# Patient Record
Sex: Female | Born: 1975 | Race: White | Hispanic: No | Marital: Married | Smoking: Never smoker
Health system: Southern US, Community
[De-identification: ages and names within clinical notes are randomized; demographics above are authoritative.]

## PROBLEM LIST (undated history)

## (undated) DIAGNOSIS — F419 Anxiety disorder, unspecified: Secondary | ICD-10-CM

## (undated) DIAGNOSIS — K638219 Small intestinal bacterial overgrowth, unspecified: Secondary | ICD-10-CM

## (undated) DIAGNOSIS — T7840XA Allergy, unspecified, initial encounter: Secondary | ICD-10-CM

## (undated) HISTORY — DX: Small intestinal bacterial overgrowth, unspecified: K63.8219

## (undated) HISTORY — DX: Allergy, unspecified, initial encounter: T78.40XA

## (undated) HISTORY — DX: Anxiety disorder, unspecified: F41.9

---

## 2002-12-06 ENCOUNTER — Other Ambulatory Visit: Admission: RE | Admit: 2002-12-06 | Discharge: 2002-12-06 | Payer: Self-pay | Admitting: Obstetrics and Gynecology

## 2005-06-30 ENCOUNTER — Other Ambulatory Visit: Admission: RE | Admit: 2005-06-30 | Discharge: 2005-06-30 | Payer: Self-pay | Admitting: Obstetrics and Gynecology

## 2006-01-26 ENCOUNTER — Inpatient Hospital Stay (HOSPITAL_COMMUNITY): Admission: AD | Admit: 2006-01-26 | Discharge: 2006-01-29 | Payer: Self-pay | Admitting: Obstetrics and Gynecology

## 2006-01-26 ENCOUNTER — Inpatient Hospital Stay (HOSPITAL_COMMUNITY): Admission: AD | Admit: 2006-01-26 | Discharge: 2006-01-26 | Payer: Self-pay | Admitting: Obstetrics and Gynecology

## 2006-08-16 ENCOUNTER — Other Ambulatory Visit: Admission: RE | Admit: 2006-08-16 | Discharge: 2006-08-16 | Payer: Self-pay | Admitting: Obstetrics and Gynecology

## 2008-08-10 ENCOUNTER — Encounter: Admission: RE | Admit: 2008-08-10 | Discharge: 2008-08-10 | Payer: Self-pay | Admitting: Internal Medicine

## 2008-09-13 ENCOUNTER — Encounter: Admission: RE | Admit: 2008-09-13 | Discharge: 2008-09-13 | Payer: Self-pay | Admitting: Gynecology

## 2008-09-20 ENCOUNTER — Encounter: Admission: RE | Admit: 2008-09-20 | Discharge: 2008-09-20 | Payer: Self-pay | Admitting: Gynecology

## 2008-09-26 ENCOUNTER — Other Ambulatory Visit: Admission: RE | Admit: 2008-09-26 | Discharge: 2008-09-26 | Payer: Self-pay | Admitting: Gynecology

## 2009-02-01 ENCOUNTER — Encounter: Admission: RE | Admit: 2009-02-01 | Discharge: 2009-02-01 | Payer: Self-pay | Admitting: Gynecology

## 2009-07-11 ENCOUNTER — Inpatient Hospital Stay (HOSPITAL_COMMUNITY): Admission: AD | Admit: 2009-07-11 | Discharge: 2009-07-11 | Payer: Self-pay | Admitting: Obstetrics and Gynecology

## 2009-07-13 ENCOUNTER — Inpatient Hospital Stay (HOSPITAL_COMMUNITY): Admission: AD | Admit: 2009-07-13 | Discharge: 2009-07-13 | Payer: Self-pay | Admitting: Obstetrics & Gynecology

## 2009-09-27 ENCOUNTER — Inpatient Hospital Stay (HOSPITAL_COMMUNITY): Admission: AD | Admit: 2009-09-27 | Discharge: 2009-09-27 | Payer: Self-pay | Admitting: Obstetrics and Gynecology

## 2009-10-07 ENCOUNTER — Inpatient Hospital Stay (HOSPITAL_COMMUNITY): Admission: AD | Admit: 2009-10-07 | Discharge: 2009-10-09 | Payer: Self-pay | Admitting: Obstetrics and Gynecology

## 2009-10-12 HISTORY — PX: CHOLECYSTECTOMY, LAPAROSCOPIC: SHX56

## 2010-01-24 ENCOUNTER — Telehealth: Payer: Self-pay | Admitting: Gastroenterology

## 2010-01-24 ENCOUNTER — Encounter (INDEPENDENT_AMBULATORY_CARE_PROVIDER_SITE_OTHER): Payer: Self-pay | Admitting: *Deleted

## 2010-02-17 ENCOUNTER — Encounter (INDEPENDENT_AMBULATORY_CARE_PROVIDER_SITE_OTHER): Payer: Self-pay | Admitting: *Deleted

## 2010-09-18 ENCOUNTER — Emergency Department (HOSPITAL_COMMUNITY): Admission: EM | Admit: 2010-09-18 | Discharge: 2010-01-24 | Payer: Self-pay | Admitting: Emergency Medicine

## 2010-11-02 ENCOUNTER — Encounter: Payer: Self-pay | Admitting: Gynecology

## 2010-11-11 NOTE — Progress Notes (Signed)
Summary: triage  Phone Note Call from Patient Call back at Home Phone [phone]   Caller: Patient Call For: Dr. Christella Hartigan Reason for Call: Talk to Nurse Summary of Call: pt sch'ed for next available, but intrerested in getting in sooner for gallbladder stones symptoms and follow up on ER visit due to severe back pain and nausea Initial call taken by: Vallarie Mare,  January 24, 2010 10:32 AM  Follow-up for Phone Call        Message left to call back   Teryl Lucy RN  January 24, 2010 11:21 AM  left message on machine to call back Chales Abrahams CMA Duncan Dull)  January 27, 2010 9:15 AM   left message on machine to call back Chales Abrahams CMA Duncan Dull)  January 28, 2010 3:20 PM

## 2010-11-11 NOTE — Consult Note (Signed)
Summary: consult   [phone] ********* correct consult date******    NAME:  Kristi [name], Kristi [name]                 ACCOUNT NO.:  [id number]      MEDICAL RECORD NO.:  [id number]          PATIENT TYPE:  EMS      LOCATION:  MAJO                         FACILITY:  [facility]      PHYSICIAN:  Juanetta Gosling, MDDATE OF BIRTH:  1976/08/07      DATE OF CONSULTATION:   DATE OF DISCHARGE:  01/24/2010                                    CONSULTATION      CONSULTING PHYSICIAN:  Marisa Severin, MD      CHIEF COMPLAINT:  Back pain.      HISTORY OF PRESENT ILLNESS:  This is a 35 year old female who is 3-1/2   months postpartum and currently breastfeeding, who awoke last night at   2315 with midback pain radiating to both sides.  The pain is associated   with some nausea.  She denies any abdominal pain, diarrhea, changes in   her bowel habits.  No fevers and no urinary symptoms.  It has been   aggravated by movement, it is better when I evaluated her this morning   and relieved with some of the medications she received in the emergency   room.  She has no history of any prior abdominal pain, right upper   quadrant pain, or especially during her pregnancy at all.  We were   consulted for evaluation for her to rule out gallbladder disease.      PAST MEDICAL HISTORY:   1. GERD.   2. Mitral valve prolapse.   3. Hypothyroidism.      PAST SURGICAL HISTORY:  None.      MEDICATIONS:  Levoxyl, prenatal vitamins, and Claritin.      ALLERGIES:  TORADOL and VICODIN which cause itching.      SOCIAL HISTORY:  She drinks about half a glass of wine 3 times per week,   is a nonsmoker.      PHYSICAL EXAMINATION:  VITAL SIGNS:  97.9, 97.4; 73-100, 103/71, 18, and   97%.   GENERAL:  She is a well-appearing female in no distress.   ABDOMEN:  Soft, nontender, nondistended with bowel sounds present.  She   has a negative Murphy sign.   BACK:  Mild tenderness in her midback, but no flank pain.      LABORATORY EVALUATION:   BUN 17, creatinine 0.78.  Her liver function   tests are alkaline phosphatase is 86, AST 118, ALT 73, total bilirubin   0.8.  Urinalysis is negative for infection.  White blood cell count is   16.7, hematocrit 38.2.  She has 90 segs on her differential.  Ultrasound   shows numerous stones, no wall thickening, no fluid, no sonographic   Murphy sign, a common bile duct of 4 mm.  Liver, spleen, and kidneys are   all within normal limits.      ASSESSMENT:  Back pain, doubt gallbladder disease as a source.      PLAN:  I think she currently really has no abdominal symptoms at  all.   Her transaminases are mildly elevated from an unknown source, but I do   not think that her current symptoms and exam are consistent with   gallbladder disease.  I am not entirely sure why her white blood cell   count is elevated as well.  There is no clinical or radiologic evidence   that this is from her gallbladder.  I have asked her to come back and   see me in 3 weeks in the office where we will repeat her labs and will   follow up with her and I gave her warning signs for gallbladder disease   if any of these develop, she should return.               Juanetta Gosling, MD            MCW/MEDQ  D:  01/24/2010  T:  01/24/2010  Job:  [id number]      cc:   Kirby Funk, MD      Electronically Signed by Emelia Loron MD on 01/25/2010 08:51:36 AM

## 2010-11-11 NOTE — Letter (Signed)
Summary: New Patient letter  [facility]  [address] [place], [place] [address]   Phone: [phone]  Fax: [phone]       01/24/2010 MRN: [id number]  [facility] [address] [place], [place]  [address]  Dear Ms. [facility],  Welcome to the Gastroenterology Division at [facility].    You are scheduled to see Dr.  Christella Hartigan on 02/21/2010 at 1:30PM on the 3rd floor at [facility], [address].  We ask that you try to arrive at our office 15 minutes prior to your appointment time to allow for check-in.  We would like you to complete the enclosed self-administered evaluation form prior to your visit and bring it with you on the day of your appointment.  We will review it with you.  Also, please bring a complete list of all your medications or, if you prefer, bring the medication bottles and we will list them.  Please bring your insurance card so that we may make a copy of it.  If your insurance requires a referral to see a specialist, please bring your referral form from your primary care physician.  Co-payments are due at the time of your visit and may be paid by cash, check or credit card.     Your office visit will consist of a consult with your physician (includes a physical exam), any laboratory testing he/she may order, scheduling of any necessary diagnostic testing (e.g. x-ray, ultrasound, CT-scan), and scheduling of a procedure (e.g. Endoscopy, Colonoscopy) if required.  Please allow enough time on your schedule to allow for any/all of these possibilities.    If you cannot keep your appointment, please call [phone] to cancel or reschedule prior to your appointment date.  This allows [place] the opportunity to schedule an appointment for another patient in need of care.  If you do not cancel or reschedule by 5 p.m. the business day prior to your appointment date, you will be charged a $50.00 late cancellation/no-show fee.    Thank you for choosing  [facility] for your medical needs.  We appreciate the opportunity to care for you.  Please visit [place] at our website  to learn more about our practice.                     Sincerely,                                                             The Gastroenterology Division

## 2010-12-30 LAB — COMPREHENSIVE METABOLIC PANEL
AST: 118 U/L — ABNORMAL HIGH (ref 0–37)
Alkaline Phosphatase: 86 U/L (ref 39–117)
Calcium: 9.7 mg/dL (ref 8.4–10.5)
Creatinine, Ser: 0.78 mg/dL (ref 0.4–1.2)
GFR calc Af Amer: >60 mL/min (ref >60)
Glucose, Bld: 116 mg/dL — ABNORMAL HIGH (ref 70–99)
Potassium: 3.4 mEq/L — ABNORMAL LOW (ref 3.5–5.1)
Sodium: 141 mEq/L (ref 135–145)

## 2010-12-30 LAB — DIFFERENTIAL
Basophils Absolute: 0.1 10*3/uL (ref 0.0–0.1)
Basophils Relative: 0 % (ref 0–1)
Eosinophils Absolute: 0 10*3/uL (ref 0.0–0.7)
Eosinophils Relative: 0 % (ref 0–5)
Lymphs Abs: 1 10*3/uL (ref 0.7–4.0)
Monocytes Absolute: 0.6 10*3/uL (ref 0.1–1.0)
Neutro Abs: 15 10*3/uL — ABNORMAL HIGH (ref 1.7–7.7)

## 2010-12-30 LAB — CBC
MCV: 96.8 fL (ref 78.0–100.0)
RBC: 3.94 MIL/uL (ref 3.87–5.11)
WBC: 16.7 10*3/uL — ABNORMAL HIGH (ref 4.0–10.5)

## 2010-12-30 LAB — URINALYSIS, ROUTINE W REFLEX MICROSCOPIC
Bilirubin Urine: NEGATIVE
Glucose, UA: NEGATIVE mg/dL
Ketones, ur: NEGATIVE mg/dL
pH: 6.5 (ref 5.0–8.0)

## 2010-12-30 LAB — POCT PREGNANCY, URINE: Preg Test, Ur: NEGATIVE

## 2011-01-12 LAB — CBC
HCT: 35.6 % — ABNORMAL LOW (ref 36.0–46.0)
Hemoglobin: 12.1 g/dL (ref 12.0–15.0)
MCHC: 34.1 g/dL (ref 30.0–36.0)
Platelets: 294 10*3/uL (ref 150–400)
Platelets: 328 10*3/uL (ref 150–400)
RDW: 13.9 % (ref 11.5–15.5)
WBC: 16.2 10*3/uL — ABNORMAL HIGH (ref 4.0–10.5)
WBC: 16.6 10*3/uL — ABNORMAL HIGH (ref 4.0–10.5)

## 2011-01-12 LAB — DIFFERENTIAL
Basophils Absolute: 0 10*3/uL (ref 0.0–0.1)
Basophils Relative: 0 % (ref 0–1)
Eosinophils Absolute: 0 10*3/uL (ref 0.0–0.7)
Lymphocytes Relative: 14 % (ref 12–46)
Lymphs Abs: 2.2 10*3/uL (ref 0.7–4.0)
Monocytes Absolute: 0.7 10*3/uL (ref 0.1–1.0)
Monocytes Relative: 4 % (ref 3–12)
Neutro Abs: 13.2 10*3/uL — ABNORMAL HIGH (ref 1.7–7.7)
Neutrophils Relative %: 82 % — ABNORMAL HIGH (ref 43–77)

## 2011-01-15 LAB — FETAL FIBRONECTIN: Fetal Fibronectin: NEGATIVE

## 2011-01-16 LAB — URINALYSIS, ROUTINE W REFLEX MICROSCOPIC
Hgb urine dipstick: NEGATIVE
Ketones, ur: NEGATIVE mg/dL
Nitrite: NEGATIVE
Specific Gravity, Urine: <1.005 — ABNORMAL LOW (ref 1.005–1.030)
Urobilinogen, UA: 0.2 mg/dL (ref 0.0–1.0)
pH: 7 (ref 5.0–8.0)
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
## 2013-05-18 ENCOUNTER — Other Ambulatory Visit (HOSPITAL_COMMUNITY): Payer: Self-pay | Admitting: Internal Medicine

## 2013-05-18 DIAGNOSIS — I341 Nonrheumatic mitral (valve) prolapse: Secondary | ICD-10-CM

## 2013-05-23 ENCOUNTER — Ambulatory Visit (HOSPITAL_COMMUNITY)
Admission: RE | Admit: 2013-05-23 | Discharge: 2013-05-23 | Disposition: A | Discharge status: Home / Self Care | Payer: BC Managed Care – PPO | Source: Ambulatory Visit | Attending: Internal Medicine | Admitting: Internal Medicine

## 2013-05-23 DIAGNOSIS — I341 Nonrheumatic mitral (valve) prolapse: Secondary | ICD-10-CM

## 2013-05-23 DIAGNOSIS — I059 Rheumatic mitral valve disease, unspecified: Secondary | ICD-10-CM

## 2013-05-23 DIAGNOSIS — R002 Palpitations: Secondary | ICD-10-CM | POA: Insufficient documentation

## 2013-05-23 DIAGNOSIS — I079 Rheumatic tricuspid valve disease, unspecified: Secondary | ICD-10-CM | POA: Insufficient documentation

## 2013-05-23 NOTE — Progress Notes (Signed)
2D Echo Performed 05/23/2013    Hoang Pettingill, RCS  

## 2013-05-26 ENCOUNTER — Telehealth: Payer: Self-pay | Admitting: Internal Medicine

## 2013-05-26 NOTE — Telephone Encounter (Signed)
Dr. Renne Crigler referred pt for echo she had on August 12.  Patient is calling Dr. Carolee Rota office for result.

## 2013-05-26 NOTE — Telephone Encounter (Signed)
Returned call.  Joy informed echo was completed.  Stated they did not receive a copy of result.  Please fax to [phone].  Informed Medical Records will be notified.  Message forwarded to Medical Records to process.

## 2013-06-06 ENCOUNTER — Ambulatory Visit: Payer: BC Managed Care – PPO | Admitting: Internal Medicine

## 2014-04-16 ENCOUNTER — Telehealth: Payer: Self-pay | Admitting: Interventional Cardiology

## 2014-04-16 DIAGNOSIS — I059 Rheumatic mitral valve disease, unspecified: Secondary | ICD-10-CM

## 2014-04-16 NOTE — Telephone Encounter (Signed)
Dr. Eldridge [facility]anasi seen [facility] in August of 2014. Echo is in the system. Do we need to repeat?

## 2014-04-16 NOTE — Telephone Encounter (Signed)
New problem   Pt calling about need to have an echocardiogram. There was no order in system. Please advise pt.

## 2014-04-18 NOTE — Telephone Encounter (Signed)
Echo one year from teh prior echo for mitral regurgitation

## 2014-04-19 NOTE — Telephone Encounter (Signed)
Pt notified. Echo ordered. Pcc will schedule.

## 2014-05-24 ENCOUNTER — Ambulatory Visit (HOSPITAL_COMMUNITY): Payer: BC Managed Care – PPO | Attending: Interventional Cardiology | Admitting: Radiology

## 2014-05-24 DIAGNOSIS — I079 Rheumatic tricuspid valve disease, unspecified: Secondary | ICD-10-CM | POA: Insufficient documentation

## 2014-05-24 DIAGNOSIS — I059 Rheumatic mitral valve disease, unspecified: Secondary | ICD-10-CM

## 2014-05-24 NOTE — Progress Notes (Signed)
Echocardiogram performed.  

## 2014-05-30 ENCOUNTER — Other Ambulatory Visit: Payer: Self-pay | Admitting: Cardiology

## 2014-05-30 DIAGNOSIS — I059 Rheumatic mitral valve disease, unspecified: Secondary | ICD-10-CM

## 2014-07-04 ENCOUNTER — Other Ambulatory Visit: Payer: Self-pay | Admitting: Obstetrics and Gynecology

## 2014-07-04 DIAGNOSIS — N6002 Solitary cyst of left breast: Secondary | ICD-10-CM

## 2014-07-04 DIAGNOSIS — N6001 Solitary cyst of right breast: Secondary | ICD-10-CM

## 2014-07-10 ENCOUNTER — Other Ambulatory Visit: Payer: BC Managed Care – PPO

## 2014-07-10 ENCOUNTER — Ambulatory Visit
Admission: RE | Admit: 2014-07-10 | Discharge: 2014-07-10 | Disposition: A | Discharge status: Home / Self Care | Payer: BC Managed Care – PPO | Source: Ambulatory Visit | Attending: Obstetrics and Gynecology | Admitting: Obstetrics and Gynecology

## 2014-07-10 DIAGNOSIS — N6001 Solitary cyst of right breast: Secondary | ICD-10-CM

## 2014-07-10 DIAGNOSIS — N6002 Solitary cyst of left breast: Secondary | ICD-10-CM

## 2015-09-24 ENCOUNTER — Other Ambulatory Visit: Payer: Self-pay | Admitting: Obstetrics and Gynecology

## 2015-09-24 DIAGNOSIS — N644 Mastodynia: Secondary | ICD-10-CM

## 2015-09-30 ENCOUNTER — Ambulatory Visit
Admission: RE | Admit: 2015-09-30 | Discharge: 2015-09-30 | Disposition: A | Discharge status: Home / Self Care | Payer: BC Managed Care – PPO | Source: Ambulatory Visit | Attending: Obstetrics and Gynecology | Admitting: Obstetrics and Gynecology

## 2015-09-30 DIAGNOSIS — N644 Mastodynia: Secondary | ICD-10-CM

## 2015-12-19 ENCOUNTER — Telehealth: Payer: Self-pay | Admitting: Interventional Cardiology

## 2015-12-19 DIAGNOSIS — I340 Nonrheumatic mitral (valve) insufficiency: Secondary | ICD-10-CM

## 2015-12-19 NOTE — Telephone Encounter (Signed)
**Note De-Identified Kristi [name] Obfuscation** Is it ok to order her Echo? Her last OV with Dr Eldridge DaceVaranasi was in August of 2014 [facility]. Please advise.

## 2015-12-19 NOTE — Telephone Encounter (Signed)
Follow Up   Pt called states that she normally has an annual ECHO, please place orders.

## 2015-12-23 NOTE — Telephone Encounter (Signed)
Echo has been ordered and [facility] to [facility] the pt to arrange date and time of Echo.

## 2015-12-23 NOTE — Telephone Encounter (Signed)
OK to order echo for mitral regurgitation

## 2016-01-20 ENCOUNTER — Other Ambulatory Visit: Payer: Self-pay

## 2016-01-20 ENCOUNTER — Ambulatory Visit (HOSPITAL_COMMUNITY): Payer: BLUE CROSS/BLUE SHIELD | Attending: Cardiovascular Disease

## 2016-01-20 DIAGNOSIS — I340 Nonrheumatic mitral (valve) insufficiency: Secondary | ICD-10-CM | POA: Diagnosis not present

## 2016-01-20 DIAGNOSIS — I071 Rheumatic tricuspid insufficiency: Secondary | ICD-10-CM | POA: Diagnosis not present

## 2017-01-22 ENCOUNTER — Other Ambulatory Visit: Payer: Self-pay | Admitting: *Deleted

## 2019-10-23 ENCOUNTER — Other Ambulatory Visit: Payer: Self-pay | Admitting: Family Medicine

## 2019-10-23 DIAGNOSIS — N80129 Deep endometriosis of ovary, unspecified ovary: Secondary | ICD-10-CM

## 2019-10-23 DIAGNOSIS — N809 Endometriosis, unspecified: Secondary | ICD-10-CM

## 2019-10-23 DIAGNOSIS — R1032 Left lower quadrant pain: Secondary | ICD-10-CM

## 2019-11-02 ENCOUNTER — Ambulatory Visit
Admission: RE | Admit: 2019-11-02 | Discharge: 2019-11-02 | Disposition: A | Discharge status: Home / Self Care | Payer: BC Managed Care – PPO | Source: Ambulatory Visit | Attending: Family Medicine | Admitting: Family Medicine

## 2019-11-02 ENCOUNTER — Other Ambulatory Visit: Payer: Self-pay

## 2019-11-02 DIAGNOSIS — N80129 Deep endometriosis of ovary, unspecified ovary: Secondary | ICD-10-CM

## 2019-11-02 DIAGNOSIS — R1032 Left lower quadrant pain: Secondary | ICD-10-CM

## 2019-11-02 DIAGNOSIS — N809 Endometriosis, unspecified: Secondary | ICD-10-CM

## 2019-11-02 MED ORDER — IOPAMIDOL (ISOVUE-300) INJECTION 61%
100.0000 mL | Freq: Once | INTRAVENOUS | Status: AC | PRN
  Administered 2019-11-02: 100 mL via INTRAVENOUS

## 2020-03-07 ENCOUNTER — Other Ambulatory Visit: Payer: Self-pay | Admitting: Obstetrics and Gynecology

## 2020-03-07 DIAGNOSIS — R928 Other abnormal and inconclusive findings on diagnostic imaging of breast: Secondary | ICD-10-CM

## 2020-03-13 ENCOUNTER — Ambulatory Visit: Payer: BC Managed Care – PPO

## 2020-03-13 ENCOUNTER — Other Ambulatory Visit: Payer: Self-pay

## 2020-03-13 ENCOUNTER — Ambulatory Visit
Admission: RE | Admit: 2020-03-13 | Discharge: 2020-03-13 | Disposition: A | Discharge status: Home / Self Care | Payer: BC Managed Care – PPO | Source: Ambulatory Visit | Attending: Obstetrics and Gynecology | Admitting: Obstetrics and Gynecology

## 2020-03-13 DIAGNOSIS — R928 Other abnormal and inconclusive findings on diagnostic imaging of breast: Secondary | ICD-10-CM

## 2020-12-19 ENCOUNTER — Other Ambulatory Visit: Payer: Self-pay | Admitting: Obstetrics and Gynecology

## 2020-12-19 DIAGNOSIS — Z1231 Encounter for screening mammogram for malignant neoplasm of breast: Secondary | ICD-10-CM

## 2021-04-01 ENCOUNTER — Other Ambulatory Visit: Payer: Self-pay

## 2021-04-01 ENCOUNTER — Ambulatory Visit
Admission: RE | Admit: 2021-04-01 | Discharge: 2021-04-01 | Disposition: A | Discharge status: Home / Self Care | Payer: BC Managed Care – PPO | Source: Ambulatory Visit | Attending: Obstetrics and Gynecology | Admitting: Obstetrics and Gynecology

## 2021-04-01 DIAGNOSIS — Z1231 Encounter for screening mammogram for malignant neoplasm of breast: Secondary | ICD-10-CM

## 2021-04-02 ENCOUNTER — Other Ambulatory Visit: Payer: Self-pay | Admitting: Obstetrics and Gynecology

## 2021-04-02 DIAGNOSIS — R928 Other abnormal and inconclusive findings on diagnostic imaging of breast: Secondary | ICD-10-CM

## 2021-04-16 ENCOUNTER — Ambulatory Visit
Admission: RE | Admit: 2021-04-16 | Discharge: 2021-04-16 | Disposition: A | Discharge status: Home / Self Care | Payer: BC Managed Care – PPO | Source: Ambulatory Visit | Attending: Obstetrics and Gynecology | Admitting: Obstetrics and Gynecology

## 2021-04-16 ENCOUNTER — Other Ambulatory Visit: Payer: Self-pay

## 2021-04-16 ENCOUNTER — Ambulatory Visit: Payer: BC Managed Care – PPO

## 2021-04-16 DIAGNOSIS — R928 Other abnormal and inconclusive findings on diagnostic imaging of breast: Secondary | ICD-10-CM

## 2021-04-28 ENCOUNTER — Other Ambulatory Visit: Payer: BC Managed Care – PPO

## 2022-09-15 IMAGING — MG MM DIGITAL DIAGNOSTIC UNILAT*L* W/ TOMO W/ CAD
4 series · 4 of 12 positions shown · non-contrast
Comparison: Previous exam(s).

CLINICAL DATA: 44-year-old female recalled from screening mammogram
dated 04/01/2021 for a possible left breast asymmetry.

EXAM:
DIGITAL DIAGNOSTIC UNILATERAL LEFT MAMMOGRAM WITH TOMOSYNTHESIS AND
CAD
TECHNIQUE: Left digital diagnostic mammography and breast tomosynthesis was
performed. The images were evaluated with computer-aided detection.

[L ML synth-2D]
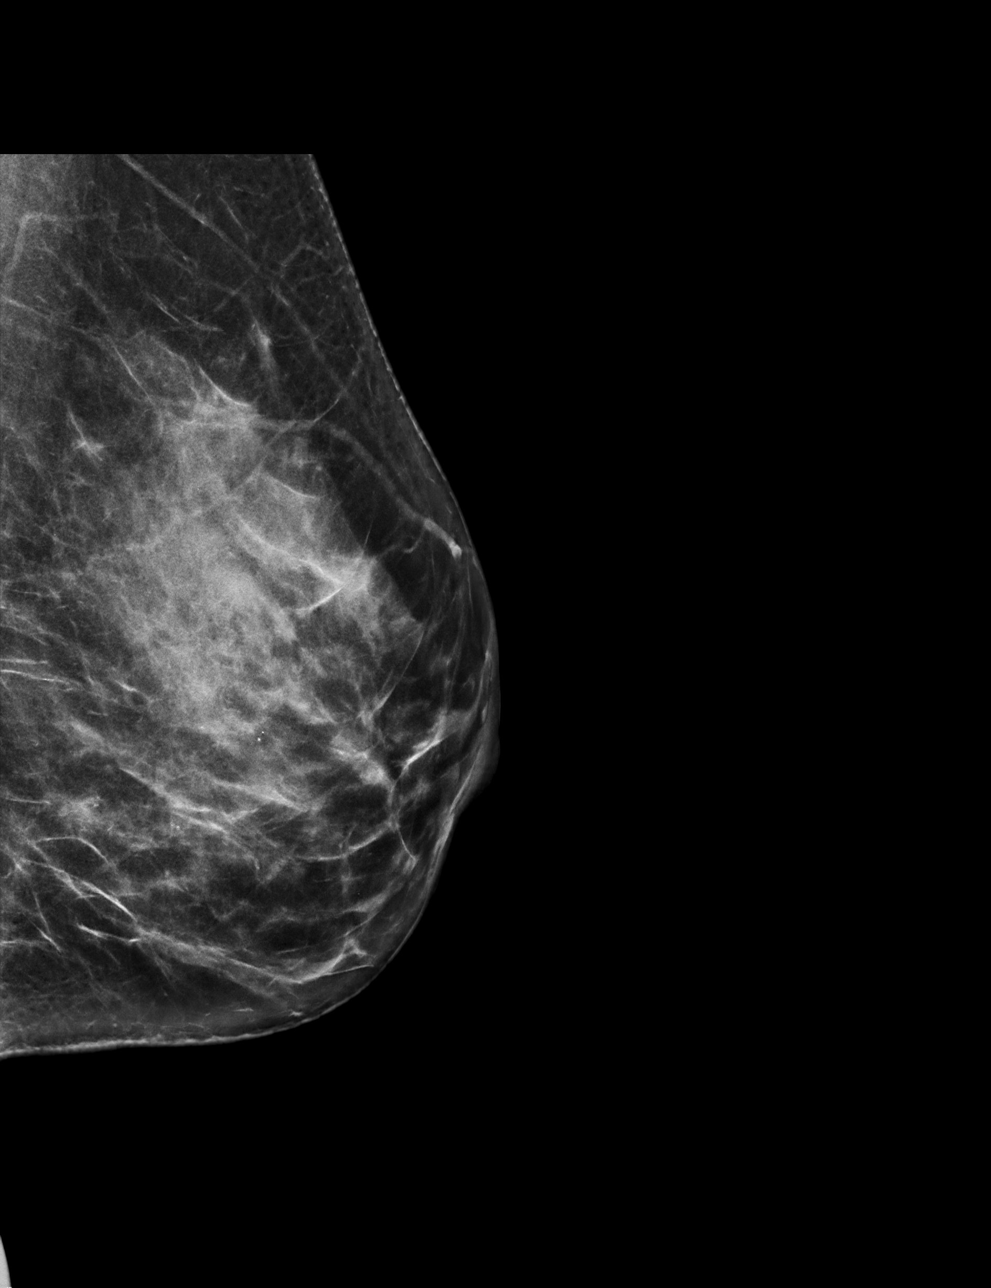

[L MLO synth-2D]
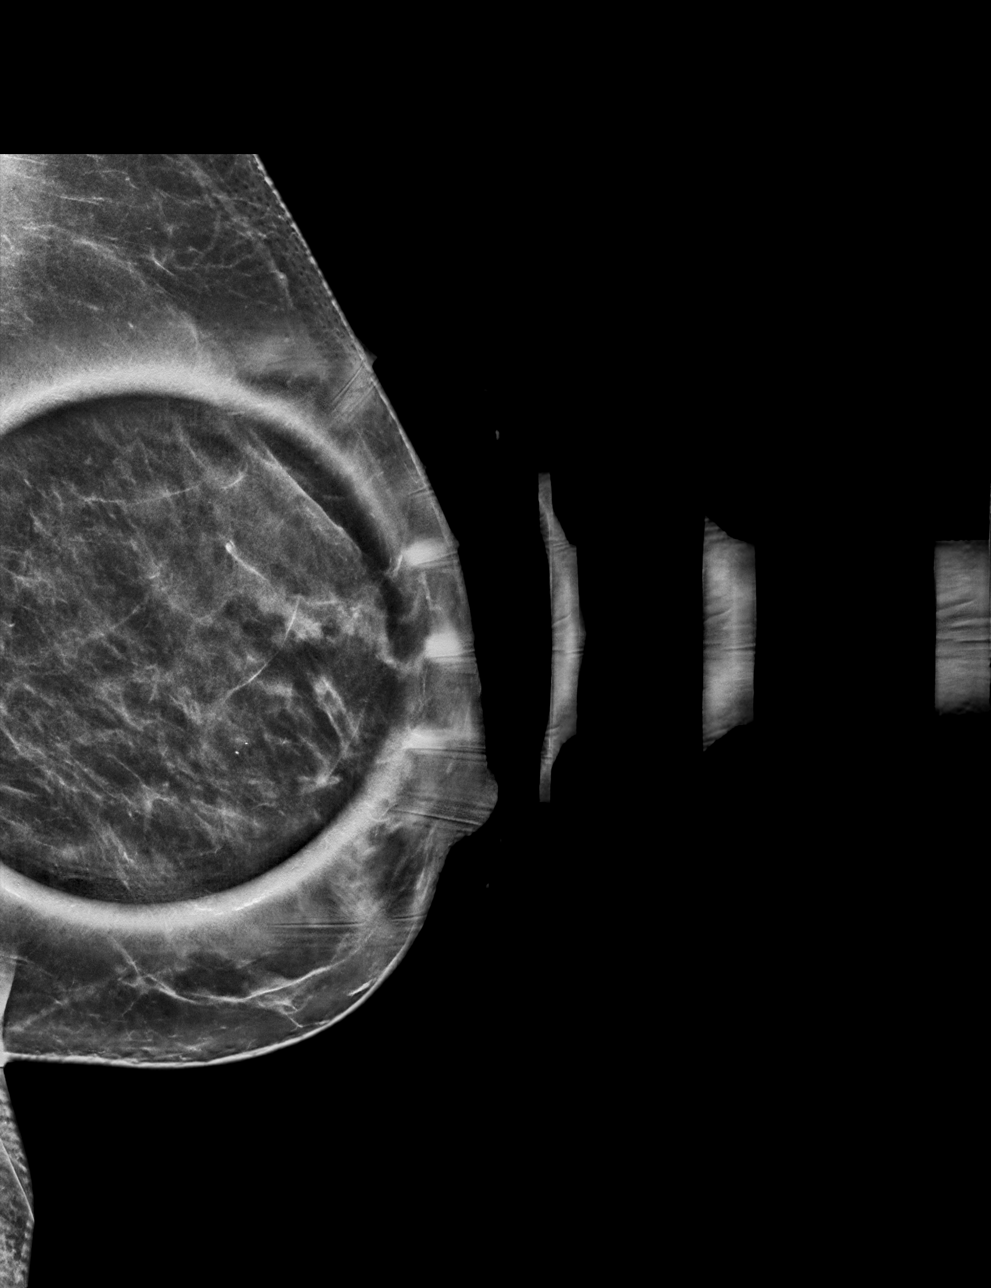

[L ML tomo · tomo slice 34/67.0]
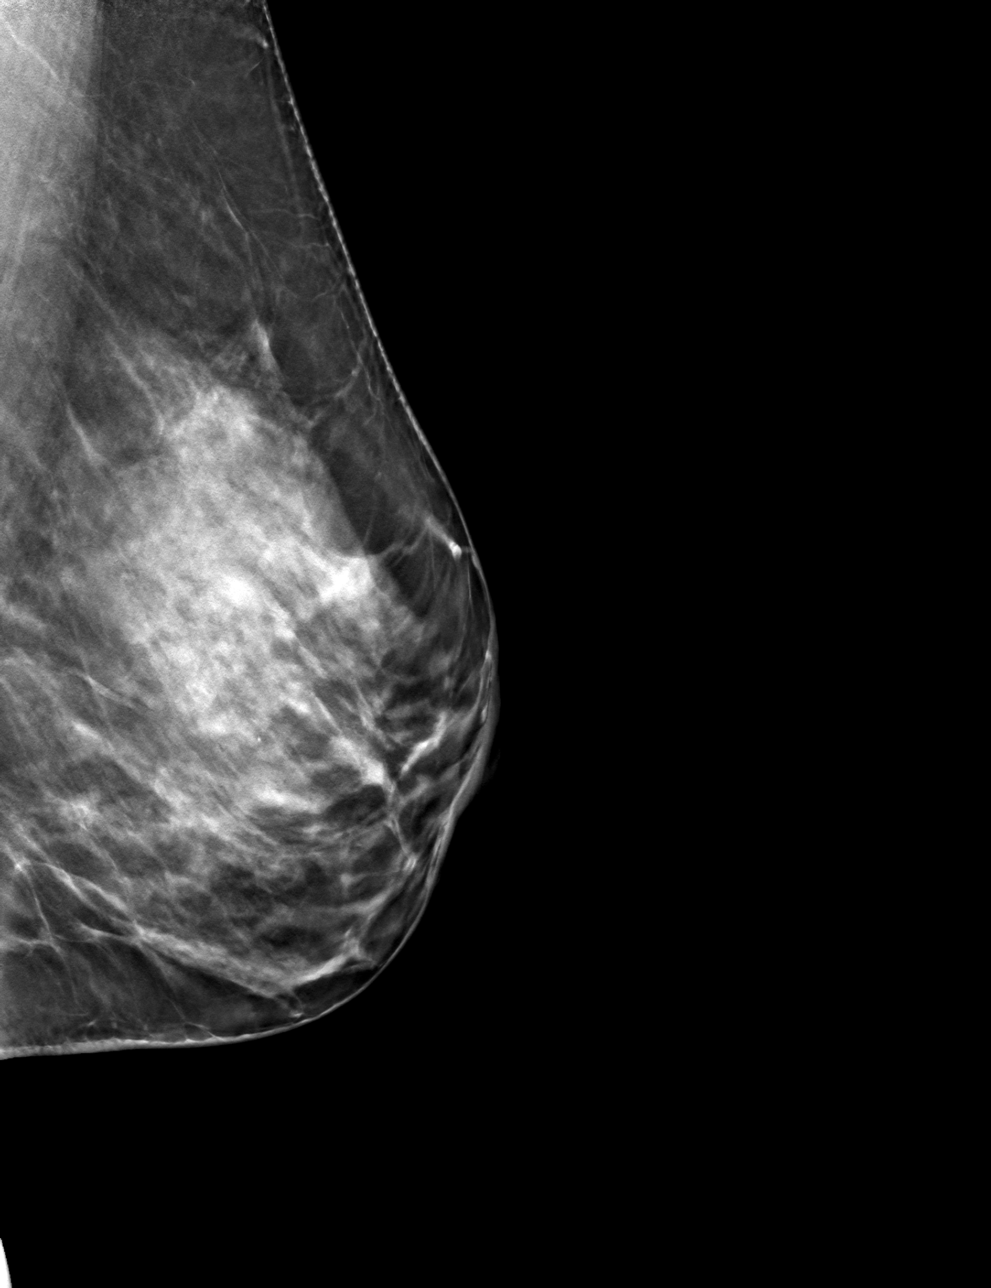

[L MLO tomo · tomo slice 33/64.0]
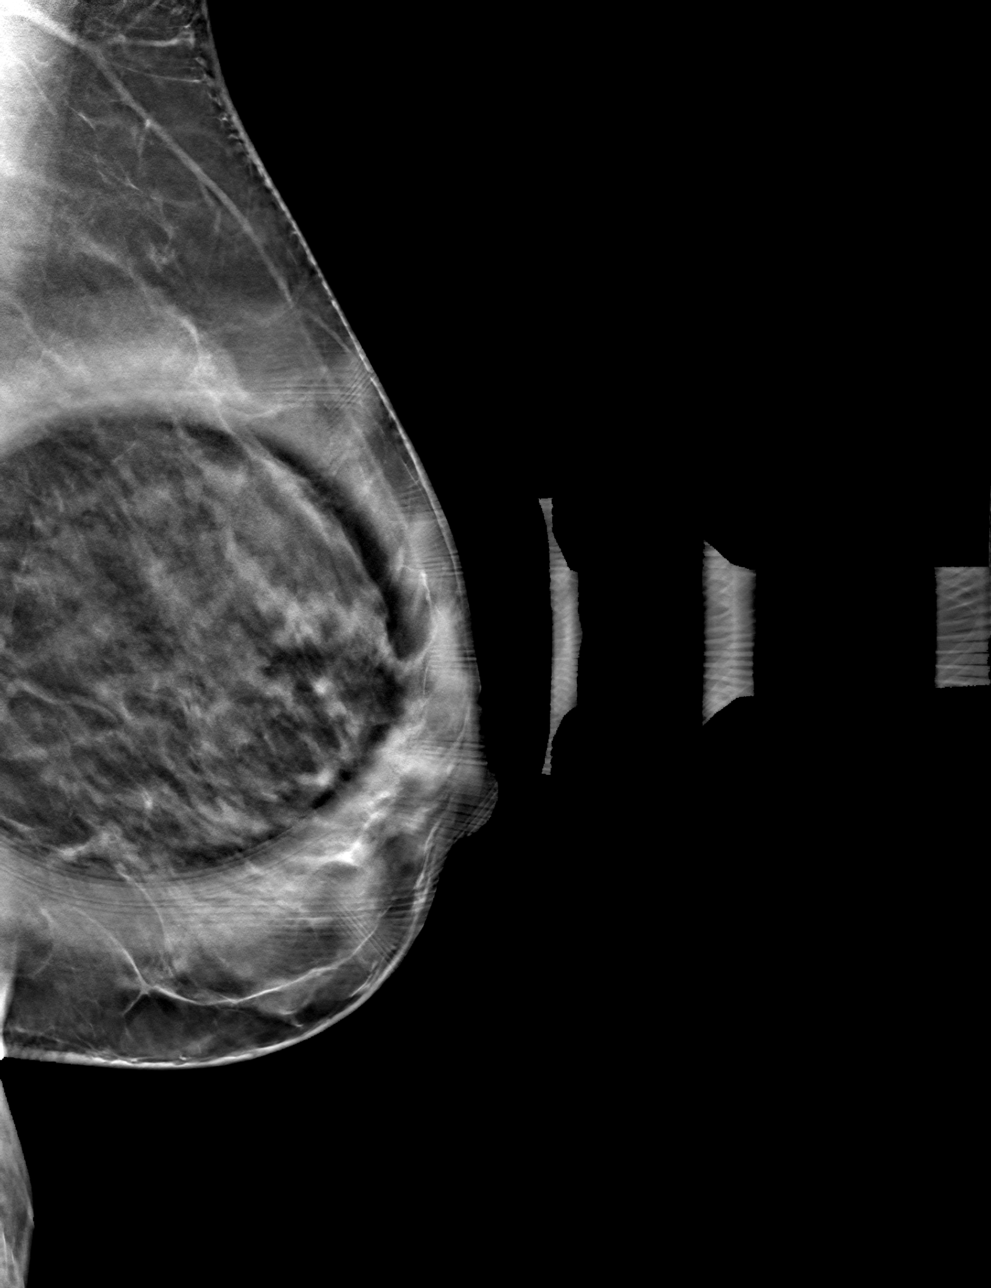

[4 of 12 positions shown; findings below may reference images not displayed]

ACR Breast Density Category c: The breast tissue is heterogeneously
dense, which may obscure small masses.
FINDINGS: Previously described, possible asymmetry in the central left breast
is seen on the MLO projection only resolves into well dispersed
fibroglandular tissue on today's additional views. No suspicious
findings are identified.
IMPRESSION: No mammographic evidence of malignancy.

RECOMMENDATION:
Screening mammogram in one year.(Code:FU-[id number])

I have discussed the findings and recommendations with the patient.
If applicable, a reminder letter will be sent to the patient
regarding the next appointment.

BI-RADS CATEGORY  1: Negative.

## 2023-04-23 LAB — HM COLONOSCOPY

## 2023-10-20 ENCOUNTER — Other Ambulatory Visit: Payer: Self-pay | Admitting: Family

## 2023-10-20 DIAGNOSIS — N644 Mastodynia: Secondary | ICD-10-CM

## 2023-11-05 ENCOUNTER — Ambulatory Visit
Admission: RE | Admit: 2023-11-05 | Discharge: 2023-11-05 | Disposition: A | Discharge status: Home / Self Care | Payer: BC Managed Care – PPO | Source: Ambulatory Visit | Attending: Family | Admitting: Family

## 2023-11-05 ENCOUNTER — Ambulatory Visit: Payer: BC Managed Care – PPO

## 2023-11-05 DIAGNOSIS — N644 Mastodynia: Secondary | ICD-10-CM

## 2024-02-24 ENCOUNTER — Other Ambulatory Visit: Payer: Self-pay | Admitting: Dermatology

## 2024-02-24 DIAGNOSIS — D489 Neoplasm of uncertain behavior, unspecified: Secondary | ICD-10-CM

## 2024-03-24 ENCOUNTER — Ambulatory Visit
Admission: RE | Admit: 2024-03-24 | Discharge: 2024-03-24 | Disposition: A | Discharge status: Home / Self Care | Source: Ambulatory Visit | Attending: Dermatology | Admitting: Dermatology

## 2024-03-24 DIAGNOSIS — D489 Neoplasm of uncertain behavior, unspecified: Secondary | ICD-10-CM

## 2024-04-26 ENCOUNTER — Other Ambulatory Visit: Payer: Self-pay | Admitting: Family Medicine

## 2024-04-26 DIAGNOSIS — R590 Localized enlarged lymph nodes: Secondary | ICD-10-CM

## 2024-05-01 ENCOUNTER — Ambulatory Visit
Admission: RE | Admit: 2024-05-01 | Discharge: 2024-05-01 | Disposition: A | Discharge status: Home / Self Care | Source: Ambulatory Visit | Attending: Family Medicine | Admitting: Family Medicine

## 2024-05-01 ENCOUNTER — Other Ambulatory Visit: Payer: Self-pay | Admitting: Obstetrics and Gynecology

## 2024-05-01 DIAGNOSIS — Z9189 Other specified personal risk factors, not elsewhere classified: Secondary | ICD-10-CM

## 2024-05-01 DIAGNOSIS — R590 Localized enlarged lymph nodes: Secondary | ICD-10-CM

## 2024-05-01 MED ORDER — IOPAMIDOL (ISOVUE-300) INJECTION 61%
100.0000 mL | Freq: Once | INTRAVENOUS | Status: AC | PRN
  Administered 2024-05-01: 100 mL via INTRAVENOUS

## 2024-05-16 ENCOUNTER — Encounter: Payer: Self-pay | Admitting: Internal Medicine

## 2024-05-17 ENCOUNTER — Other Ambulatory Visit: Payer: Self-pay | Admitting: Internal Medicine

## 2024-05-17 DIAGNOSIS — R634 Abnormal weight loss: Secondary | ICD-10-CM

## 2024-05-19 ENCOUNTER — Other Ambulatory Visit

## 2024-05-24 ENCOUNTER — Other Ambulatory Visit

## 2024-05-26 ENCOUNTER — Other Ambulatory Visit

## 2024-05-26 ENCOUNTER — Ambulatory Visit
Admission: RE | Admit: 2024-05-26 | Discharge: 2024-05-26 | Disposition: A | Discharge status: Home / Self Care | Source: Ambulatory Visit | Attending: Internal Medicine | Admitting: Internal Medicine

## 2024-05-26 DIAGNOSIS — R634 Abnormal weight loss: Secondary | ICD-10-CM

## 2024-05-26 MED ORDER — IOPAMIDOL (ISOVUE-300) INJECTION 61%
100.0000 mL | Freq: Once | INTRAVENOUS | Status: AC | PRN
  Administered 2024-05-26: 100 mL via INTRAVENOUS

## 2024-06-22 ENCOUNTER — Inpatient Hospital Stay

## 2024-06-22 VITALS — BP 115/82 | HR 98 | Temp 97.3°F | Resp 20 | Wt 138.3 lb

## 2024-06-22 DIAGNOSIS — D72829 Elevated white blood cell count, unspecified: Secondary | ICD-10-CM | POA: Insufficient documentation

## 2024-06-22 DIAGNOSIS — Z79899 Other long term (current) drug therapy: Secondary | ICD-10-CM | POA: Insufficient documentation

## 2024-06-22 DIAGNOSIS — K580 Irritable bowel syndrome with diarrhea: Secondary | ICD-10-CM | POA: Insufficient documentation

## 2024-06-22 DIAGNOSIS — E039 Hypothyroidism, unspecified: Secondary | ICD-10-CM | POA: Insufficient documentation

## 2024-06-22 DIAGNOSIS — E785 Hyperlipidemia, unspecified: Secondary | ICD-10-CM | POA: Insufficient documentation

## 2024-06-22 DIAGNOSIS — N809 Endometriosis, unspecified: Secondary | ICD-10-CM | POA: Insufficient documentation

## 2024-06-22 NOTE — Progress Notes (Signed)
 [facility] CONSULT NOTE  Patient Care Team: Signa Dire, MD (Inactive) as PCP - General (Family Medicine)   ASSESSMENT & PLAN 48 y.o.female with history of hypothyroidism, hyperlipidemia, IBS, endometriosis being seen for leukocytosis.  We had a long discussion regarding [place] diagnosis of leukocytosis.  Patient had resolved leukocytosis associated with previous pregnancy, infectious process.most recent leukocytosis was after she was stung by yellow jacket.  No repeat testing after that.  Clinically without any palpable mass or lymphadenopathy, or other concerning symptoms.  Her children having cold-like illness this week.  Discussed postpone repeat CBC.  Patient is very anxious about her leukocytosis.  She is more comfortable repeat testing next week.  We will schedule follow-up for her to get blood draw.  Assessment & Plan Leukocytosis, unspecified type Lab ordered for next week Follow-up pending results.  Orders Placed This Encounter  Procedures   CBC with Differential (Cancer Center Only)    Standing Status:   Future    Expiration Date:   06/22/2025   Lactate dehydrogenase    Standing Status:   Future    Expiration Date:   06/22/2025   CMP (Cancer Center only)    Standing Status:   Future    Expiration Date:   06/22/2025     Pauletta JAYSON Chihuahua, MD 06/22/2024 5:08 PM   CHIEF COMPLAINTS/PURPOSE OF CONSULTATION:  Leukocytosis   HISTORY OF PRESENTING ILLNESS:  Maydell L Pilger 48 y.o. female is here because of elevated WBC.  From [facility], patient had leukocytosis in 2010 and 2011.  WBC was over 16, ANC was 13 in 2010, and 15 in 2011.  Hemoglobin was normal.  MCV was borderline higher than normal.  Platelet number was normal.  This was around the time of her delivery.  Recent outside labs below: 12/27/23 WBC 8.8 hemoglobin 13 MCV 97 platelet 309 04/25/24 WBC 11.6 hemoglobin 14.5 MCV 96 platelet 309 absolute neutrophil 9.3 CRP less than 1 04/28/24 WBC 6.9  hemoglobin 14.9 MCV 99 platelet 331 ANC 4.8. 05/15/24 WBC 12.5 hemoglobin 14.6 MCV 101 RDW 12 platelet 324.  ANC 8.6. cortisol 20.9  Report she started a course of antibiotics for sore throat in June. She googled and thought she has lymphoma and wakes up anxious since then. She has lost some weight. WBC was 11.6 on 7/15 around the time and normalized on 7/18. Her LN resolved and CT of Neck was normal in July. She went to PCP and Gyn and resulted in a cascade of blood works. She was referred to Endocrinology for level of cortisol, elevated white blood cell counts. She had yellow jacket sting in July. Report a week later on 05/15/24 WBC was 12.5.  She has mid back pain. Report a CT abdomen found on 05/31/24 was normal.   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 Report level is being rechecked. She was found to have IBS and placed on Cholestyramine.  No drenching sweats. She has panic attacks a few times. Report she lost a few pounds and regain a few in 6 months. Appetite is good. No new mass. No difficulty urinating, hematuria, bloody in the stool, melena. Colonoscopy was normal last year.  No cigarettes or vaping.   MEDICAL HISTORY:  hypothyroidism, hyperlipidemia, IBS, endometriosis  SURGICAL HISTORY: No past surgical history on file.  SOCIAL HISTORY: Social History   Socioeconomic History   Marital status: Married    Spouse name: Not on file   Number of children: Not on file  Years of education: Not on file   Highest education level: Not on file  Occupational History   Not on file  Tobacco Use   Smoking status: Not on file   Smokeless tobacco: Not on file  Substance and Sexual Activity   Alcohol use: Not on file   Drug use: Not on file   Sexual activity: Not on file  Other Topics Concern   Not on file  Social History Narrative   Not on file   Social Drivers of Health   Financial Resource Strain: Not on file  Food Insecurity: No Food Insecurity (06/22/2024)    Hunger Vital Sign    Worried About Running Out of Food in the Last Year: Never true    Ran Out of Food in the Last Year: Never true  Transportation Needs: No Transportation Needs (06/22/2024)   PRAPARE - Administrator, Civil Service (Medical): No    Lack of Transportation (Non-Medical): No  Physical Activity: Not on file  Stress: Not on file  Social Connections: Not on file  Intimate Partner Violence: Not At Risk (06/22/2024)   Humiliation, Afraid, Rape, and Kick questionnaire    Fear of Current or Ex-Partner: No    Emotionally Abused: No    Physically Abused: No    Sexually Abused: No    FAMILY HISTORY: No family history on file.  ALLERGIES:  has no allergies on file.  MEDICATIONS:  Current Outpatient Medications  Medication Sig Dispense Refill   cetirizine (ZYRTEC) 10 MG tablet Take 10 mg by mouth daily as needed for allergies.     cholestyramine (QUESTRAN) 4 g packet Take 1 packet by mouth daily.     clonazePAM (KLONOPIN) 1 MG tablet Take 1 mg by mouth 2 (two) times daily as needed.     Fiber Select Gummies CHEW as directed Orally     ibuprofen (ADVIL) 200 MG tablet Take 200 mg by mouth as needed.     loratadine (CLARITIN) 10 MG tablet Take 10 mg by mouth daily as needed for allergies.     melatonin 3 MG TABS tablet Take 3 mg by mouth at bedtime as needed.     Multiple Vitamin (MULTIVITAMIN ADULT PO) Take 1 tablet by mouth daily.     venlafaxine XR (EFFEXOR-XR) 37.5 MG 24 hr capsule Take 37.5 mg by mouth daily with breakfast. Hasn't started (Patient not taking: Reported on 06/22/2024)     No current facility-administered medications for this visit.    REVIEW OF SYSTEMS:   All relevant systems were reviewed with the patient and are negative.  PHYSICAL EXAMINATION:  Vitals:   06/22/24 1536 06/22/24 1541  BP: 115/82 115/82  Pulse: 98 98  Resp: 20 20  Temp: (!) 97.3 F (36.3 C) (!) 97.3 F (36.3 C)  SpO2: 100% 100%   Filed Weights   06/22/24 1536  06/22/24 1541  Weight: 138 lb 4.8 oz (62.7 kg) 138 lb 4.8 oz (62.7 kg)    GENERAL: alert, no distress and comfortable SKIN: skin color normal, no rashes or paleness EYES: normal, sclera clear OROPHARYNX: no exudate, no erythema  NECK: supple, non-tender, without nodularity LYMPH:  no palpable cervical, axillary, epitrochlear lymphadenopathy LUNGS: clear to auscultation and no wheezes, rales and with normal breathing effort HEART: regular rate & rhythm and no murmurs ABDOMEN: abdomen soft, non-tender and nondistended Musculoskeletal: and no lower extremity edema   LABORATORY DATA:  I have reviewed the data as listed No results found for this or any  previous visit (from the past 2160 hours).  RADIOGRAPHIC STUDIES: I have personally reviewed the radiological images as listed and agreed with the findings in the report. CT ABDOMEN PELVIS W CONTRAST Result Date: 05/31/2024 CLINICAL DATA:  Unexplained weight loss. Diarrhea. Family history of pancreatic disease. EXAM: CT ABDOMEN AND PELVIS WITH CONTRAST TECHNIQUE: Multidetector CT imaging of the abdomen and pelvis was performed using the standard protocol following bolus administration of intravenous contrast. RADIATION DOSE REDUCTION: This exam was performed according to the departmental dose-optimization program which includes automated exposure control, adjustment of the mA and/or kV according to patient size and/or use of iterative reconstruction technique. CONTRAST:  100mL ISOVUE -300 IOPAMIDOL  (ISOVUE -300) INJECTION 61% COMPARISON:  11/02/2019 FINDINGS: Lower Chest: No acute findings. Hepatobiliary: No suspicious hepatic masses identified. Prior cholecystectomy. No evidence of biliary obstruction. Pancreas: Normal appearance. No mass, pancreatic ductal dilatation, or inflammatory changes. Spleen: Within normal limits in size and appearance. Adrenals/Urinary Tract: No suspicious masses identified. No evidence of ureteral calculi or hydronephrosis.  Stomach/Bowel: No evidence of obstruction, inflammatory process or abnormal fluid collections. Normal appendix visualized. Vascular/Lymphatic: No pathologically enlarged lymph nodes. No acute vascular findings. Reproductive:  No mass or other significant abnormality. Other:  None. Musculoskeletal:  No suspicious bone lesions identified. IMPRESSION: Negative. No evidence of pancreatic pathology or other significant abnormality. Electronically Signed   By: Norleen DELENA Kil M.D.   On: 05/31/2024 09:45

## 2024-06-26 ENCOUNTER — Encounter: Payer: Self-pay | Admitting: Gastroenterology

## 2024-06-27 ENCOUNTER — Other Ambulatory Visit: Payer: Self-pay

## 2024-06-27 ENCOUNTER — Other Ambulatory Visit: Payer: Self-pay | Admitting: Gastroenterology

## 2024-06-27 DIAGNOSIS — D72829 Elevated white blood cell count, unspecified: Secondary | ICD-10-CM

## 2024-06-28 ENCOUNTER — Ambulatory Visit: Admitting: Gastroenterology

## 2024-06-29 ENCOUNTER — Inpatient Hospital Stay

## 2024-06-29 ENCOUNTER — Institutional Professional Consult (permissible substitution) (INDEPENDENT_AMBULATORY_CARE_PROVIDER_SITE_OTHER): Admitting: Otolaryngology

## 2024-06-29 ENCOUNTER — Ambulatory Visit: Payer: Self-pay

## 2024-06-29 DIAGNOSIS — D72828 Other elevated white blood cell count: Secondary | ICD-10-CM

## 2024-06-29 DIAGNOSIS — D72829 Elevated white blood cell count, unspecified: Secondary | ICD-10-CM | POA: Diagnosis not present

## 2024-06-29 LAB — CBC WITH DIFFERENTIAL (CANCER CENTER ONLY)
Abs Immature Granulocytes: 0.03 K/uL (ref 0.00–0.07)
Basophils Absolute: 0 K/uL (ref 0.0–0.1)
Basophils Relative: 0 %
Eosinophils Absolute: 0 K/uL (ref 0.0–0.5)
Eosinophils Relative: 0 %
HCT: 42.3 % (ref 36.0–46.0)
Hemoglobin: 14.5 g/dL (ref 12.0–15.0)
Immature Granulocytes: 0 %
Lymphocytes Relative: 15 %
Lymphs Abs: 1.7 K/uL (ref 0.7–4.0)
MCH: 33.1 pg (ref 26.0–34.0)
MCHC: 34.3 g/dL (ref 30.0–36.0)
MCV: 96.6 fL (ref 80.0–100.0)
Monocytes Absolute: 0.6 K/uL (ref 0.1–1.0)
Monocytes Relative: 6 %
Neutro Abs: 8.9 K/uL — ABNORMAL HIGH (ref 1.7–7.7)
Neutrophils Relative %: 79 %
Platelet Count: 322 K/uL (ref 150–400)
RBC: 4.38 MIL/uL (ref 3.87–5.11)
RDW: 12.9 % (ref 11.5–15.5)
WBC Count: 11.4 K/uL — ABNORMAL HIGH (ref 4.0–10.5)
nRBC: 0 % (ref 0.0–0.2)

## 2024-06-29 LAB — VITAMIN D 25 HYDROXY (VIT D DEFICIENCY, FRACTURES): Vit D, 25-Hydroxy: 37.31 ng/mL (ref 30–100)

## 2024-06-29 LAB — CMP (CANCER CENTER ONLY)
ALT: 16 U/L (ref 0–44)
AST: 15 U/L (ref 15–41)
Albumin: 4.4 g/dL (ref 3.5–5.0)
Alkaline Phosphatase: 55 U/L (ref 38–126)
Anion gap: 8 (ref 5–15)
BUN: 14 mg/dL (ref 6–20)
CO2: 27 mmol/L (ref 22–32)
Calcium: 9.8 mg/dL (ref 8.9–10.3)
Chloride: 105 mmol/L (ref 98–111)
Creatinine: 0.82 mg/dL (ref 0.44–1.00)
GFR, Estimated: >60 mL/min (ref >60)
Glucose, Bld: 95 mg/dL (ref 70–99)
Potassium: 3.9 mmol/L (ref 3.5–5.1)
Sodium: 140 mmol/L (ref 135–145)
Total Bilirubin: 0.4 mg/dL (ref 0.0–1.2)
Total Protein: 7.5 g/dL (ref 6.5–8.1)

## 2024-06-29 LAB — MAGNESIUM: Magnesium: 2.2 mg/dL (ref 1.7–2.4)

## 2024-06-29 LAB — LACTATE DEHYDROGENASE: LDH: 131 U/L (ref 98–192)

## 2024-06-30 LAB — ZINC: Zinc: 69 ug/dL (ref 44–115)

## 2024-07-02 LAB — VITAMIN A: Vitamin A (Retinoic Acid): 48.5 ug/dL (ref 20.1–62.0)

## 2024-07-02 LAB — VITAMIN K1, SERUM: VITAMIN K1: 0.79 ng/mL (ref 0.10–2.20)

## 2024-07-02 LAB — VITAMIN E
Vitamin E (Alpha Tocopherol): 16.1 mg/L (ref 7.0–25.1)
Vitamin E(Gamma Tocopherol): 1.5 mg/L (ref 0.5–5.5)

## 2024-07-05 ENCOUNTER — Encounter (HOSPITAL_COMMUNITY): Payer: Self-pay | Admitting: Gastroenterology

## 2024-07-05 ENCOUNTER — Telehealth: Payer: Self-pay

## 2024-07-05 NOTE — Telephone Encounter (Signed)
 Scheduled appointments per result note from Dr.Chang. Talked with the patient and she is aware of the made appointments.

## 2024-07-06 ENCOUNTER — Encounter: Admitting: Oncology

## 2024-07-06 ENCOUNTER — Other Ambulatory Visit

## 2024-07-07 ENCOUNTER — Ambulatory Visit (HOSPITAL_COMMUNITY): Admitting: Certified Registered Nurse Anesthetist

## 2024-07-07 ENCOUNTER — Ambulatory Visit (HOSPITAL_COMMUNITY)
Admission: RE | Admit: 2024-07-07 | Discharge: 2024-07-07 | Disposition: A | Discharge status: Home / Self Care | Attending: Gastroenterology | Admitting: Gastroenterology

## 2024-07-07 ENCOUNTER — Encounter (HOSPITAL_COMMUNITY)
Admission: RE | Disposition: A | Discharge status: Home / Self Care | Payer: Self-pay | Source: Home / Self Care | Attending: Gastroenterology

## 2024-07-07 ENCOUNTER — Encounter (HOSPITAL_COMMUNITY): Payer: Self-pay | Admitting: Gastroenterology

## 2024-07-07 ENCOUNTER — Other Ambulatory Visit: Payer: Self-pay

## 2024-07-07 DIAGNOSIS — R1013 Epigastric pain: Secondary | ICD-10-CM | POA: Insufficient documentation

## 2024-07-07 DIAGNOSIS — Z800 Family history of malignant neoplasm of digestive organs: Secondary | ICD-10-CM | POA: Diagnosis not present

## 2024-07-07 DIAGNOSIS — Z9049 Acquired absence of other specified parts of digestive tract: Secondary | ICD-10-CM | POA: Insufficient documentation

## 2024-07-07 DIAGNOSIS — R1084 Generalized abdominal pain: Secondary | ICD-10-CM | POA: Diagnosis present

## 2024-07-07 HISTORY — PX: EUS: SHX5427

## 2024-07-07 HISTORY — PX: ESOPHAGOGASTRODUODENOSCOPY: SHX5428

## 2024-07-07 SURGERY — ULTRASOUND, UPPER GI TRACT, ENDOSCOPIC
Anesthesia: Monitor Anesthesia Care

## 2024-07-07 MED ORDER — LIDOCAINE 2% (20 MG/ML) 5 ML SYRINGE
INTRAMUSCULAR | Status: DC | PRN
  Administered 2024-07-07: 40 mg via INTRAVENOUS

## 2024-07-07 MED ORDER — MIDAZOLAM HCL 2 MG/2ML IJ SOLN
INTRAMUSCULAR | Status: DC | PRN
  Administered 2024-07-07: 2 mg via INTRAVENOUS

## 2024-07-07 MED ORDER — PROPOFOL 500 MG/50ML IV EMUL
INTRAVENOUS | Status: DC | PRN
  Administered 2024-07-07: 180 ug/kg/min via INTRAVENOUS

## 2024-07-07 MED ORDER — PHENYLEPHRINE 80 MCG/ML (10ML) SYRINGE FOR IV PUSH (FOR BLOOD PRESSURE SUPPORT)
PREFILLED_SYRINGE | INTRAVENOUS | Status: DC | PRN
  Administered 2024-07-07: 80 ug via INTRAVENOUS

## 2024-07-07 MED ORDER — PROPOFOL 10 MG/ML IV BOLUS
INTRAVENOUS | Status: DC | PRN
Start: 2024-07-07 — End: 2024-07-07
  Administered 2024-07-07 (×4): 20 mg via INTRAVENOUS

## 2024-07-07 MED ORDER — MIDAZOLAM HCL 2 MG/2ML IJ SOLN
INTRAMUSCULAR | Status: AC
  Filled 2024-07-07: qty 2

## 2024-07-07 MED ORDER — DEXMEDETOMIDINE HCL IN NACL 80 MCG/20ML IV SOLN
INTRAVENOUS | Status: DC | PRN
  Administered 2024-07-07: 8 ug via INTRAVENOUS

## 2024-07-07 MED ORDER — SODIUM CHLORIDE 0.9 % IV SOLN: INTRAVENOUS | Status: DC

## 2024-07-07 NOTE — Transfer of Care (Signed)
 Immediate Anesthesia Transfer of Care Note  Patient: Dura L Schrecengost  Procedure(s) Performed: ULTRASOUND, UPPER GI TRACT, ENDOSCOPIC  Patient Location: Endoscopy Unit  Anesthesia Type:MAC  Level of Consciousness: sedated  Airway & Oxygen Therapy: Patient Spontanous Breathing and Patient connected to face mask oxygen  Post-op Assessment: Report given to RN and Post -op Vital signs reviewed and stable  Post vital signs: Reviewed and stable  Last Vitals:  Vitals Value Taken Time  BP 76/40 07/07/24 10:10  Temp    Pulse 77 07/07/24 10:11  Resp 28 07/07/24 10:11  SpO2 100 % 07/07/24 10:11  Vitals shown include unfiled device data.  Last Pain:  Vitals:   07/07/24 0918  TempSrc: Temporal  PainSc: 0-No pain         Complications: No notable events documented.

## 2024-07-07 NOTE — Anesthesia Postprocedure Evaluation (Signed)
 Anesthesia Post Note  Patient: Kristi [name]  Procedure(s) Performed: ULTRASOUND, UPPER GI TRACT, ENDOSCOPIC     Patient location during evaluation: Endoscopy Anesthesia Type: MAC Level of consciousness: awake and alert Pain management: pain level controlled Vital Signs Assessment: post-procedure vital signs reviewed and stable Respiratory status: spontaneous breathing, nonlabored ventilation, respiratory function stable and patient connected to nasal cannula oxygen Cardiovascular status: stable and blood pressure returned to baseline Postop Assessment: no apparent nausea or vomiting Anesthetic complications: no   No notable events documented.  Last Vitals:  Vitals:   07/07/24 1020 07/07/24 1030  BP: 103/61 99/64  Pulse: 65 65  Resp: 13 19  Temp:    SpO2: 98% 97%    Last Pain:  Vitals:   07/07/24 1030  TempSrc:   PainSc: 2                  Rendy Lazard S

## 2024-07-07 NOTE — Anesthesia Preprocedure Evaluation (Signed)
 Anesthesia Evaluation  Patient identified by MRN, date of birth, ID band Patient awake    Reviewed: Allergy & Precautions, H&P , NPO status , Patient's Chart, lab work & pertinent test results  Airway Mallampati: II   Neck ROM: full    Dental   Pulmonary neg pulmonary ROS   breath sounds clear to auscultation       Cardiovascular negative cardio ROS  Rhythm:regular Rate:Normal     Neuro/Psych   Anxiety        GI/Hepatic IBS   Endo/Other    Renal/GU      Musculoskeletal   Abdominal   Peds  Hematology   Anesthesia Other Findings   Reproductive/Obstetrics                              Anesthesia Physical Anesthesia Plan  ASA: 2  Anesthesia Plan: MAC   Post-op Pain Management:    Induction: Intravenous  PONV Risk Score and Plan: 2 and Propofol  infusion and Treatment may vary due to age or medical condition  Airway Management Planned: Nasal Cannula  Additional Equipment:   Intra-op Plan:   Post-operative Plan:   Informed Consent: I have reviewed the patients History and Physical, chart, labs and discussed the procedure including the risks, benefits and alternatives for the proposed anesthesia with the patient or authorized representative who has indicated his/her understanding and acceptance.     Dental advisory given  Plan Discussed with: CRNA, Anesthesiologist and Surgeon  Anesthesia Plan Comments:         Anesthesia Quick Evaluation

## 2024-07-07 NOTE — H&P (Signed)
 Jaielle L Tarter HPI: The patient reports some upper abdominal pain and there is a history of pancreatic cancer in her brother.  History reviewed. No pertinent past medical history.  History reviewed. No pertinent surgical history.  History reviewed. No pertinent family history.  Social History:  has no history on file for tobacco use, alcohol use, and drug use.  Allergies: No Known Allergies  Medications: Scheduled: Continuous:  sodium chloride       No results found for this or any previous visit (from the past 24 hours).   No results found.  ROS:  As stated above in the HPI otherwise negative.  Blood pressure 124/75, pulse 98, temperature 99.1 F (37.3 C), temperature source Temporal, resp. rate 10, height 5' 2 (1.575 m), weight 61.2 kg, last menstrual period 07/02/2024, SpO2 99%.    PE: Gen: NAD, Alert and Oriented HEENT:  [place]/AT, EOMI Neck: Supple, no LAD Lungs: CTA Bilaterally CV: RRR without M/G/R ABD: Soft, NTND, +BS Ext: No C/C/E  Assessment/Plan: 1) Upper abdominal discomfort. 2) First degree relative with pancreatic cancer (brother - deceased).  Plan: 1) EUS.  Vaunda Gutterman D 07/07/2024, 9:51 AM

## 2024-07-07 NOTE — Op Note (Signed)
 [facility] Patient Name: Kristi [name] Procedure Date: 07/07/2024 MRN: [id number] Attending MD: Belvie Just , MD, [id number] Date of Birth: [birth year] CSN: [id number] Age: 48 Admit Type: Ambulatory Procedure:                Upper EUS Indications:              Epigastric abdominal pain Providers:                Belvie Just, MD, Randall Lines, RN, Fairy [place], Technician Referring MD:             Belvie Just, MD Medicines:                Propofol  per Anesthesia Complications:            No immediate complications. Estimated Blood Loss:     Estimated blood loss: none. Procedure:                Pre-Anesthesia Assessment:                           - Prior to the procedure, a History and Physical                            was performed, and patient medications and                            allergies were reviewed. The patient's tolerance of                            previous anesthesia was also reviewed. The risks                            and benefits of the procedure and the sedation                            options and risks were discussed with the patient.                            All questions were answered, and informed consent                            was obtained. Prior Anticoagulants: The patient has                            taken no anticoagulant or antiplatelet agents. ASA                            Grade Assessment: II - A patient with mild systemic                            disease. After reviewing the risks and benefits,  the patient was deemed in satisfactory condition to                            undergo the procedure.                           - Sedation was administered by an anesthesia                            professional. Deep sedation was attained.                           After obtaining informed consent, the endoscope was                            passed under direct  vision. Throughout the                            procedure, the patient's blood pressure, pulse, and                            oxygen saturations were monitored continuously. The                            [id number] ([id number] Olympus endosonoscope was                            introduced through the mouth, and advanced to the                            second part of duodenum. The upper EUS was                            accomplished without difficulty. The patient                            tolerated the procedure well. Scope In: Scope Out: Findings:      ENDOSONOGRAPHIC FINDING: :      Visualized portions of the left lobe of the liver, spleen, left adrenal       gland, left kidney, common bile duct, common hepatic duct and pancreas       were normal on ultrasound examination. No abnormal-appearing lymph nodes       were identified in the abdomen.      A very detailed examination of the pancreas was performed. The entire       pancreas was normal. There was no evidence of any masses, PD dilation,       or stones. Impression:               - No specimens collected.                           - Normal pancreas.                           - S/p cholecystectomy. Moderate Sedation:  Not Applicable - Patient had care per Anesthesia. Recommendation:           - Patient has a contact number available for                            emergencies. The signs and symptoms of potential                            delayed complications were discussed with the                            patient. Return to normal activities tomorrow.                            Written discharge instructions were provided to the                            patient.                           - Resume regular diet.                           - Follow up with Dr. Kristie as needed. Procedure Code(s):        --- Professional ---                           [id number], Esophagogastroduodenoscopy, flexible,                             transoral; with endoscopic ultrasound examination                            limited to the esophagus, stomach or duodenum, and                            adjacent structures Diagnosis Code(s):        --- Professional ---                           R10.13, Epigastric pain CPT copyright 2022 American Medical Association. All rights reserved. The codes documented in this report are preliminary and upon coder review may  be revised to meet current compliance requirements. Belvie Just, MD Belvie Just, MD 07/07/2024 10:17:02 AM This report has been signed electronically. Number of Addenda: 0

## 2024-07-07 NOTE — Discharge Instructions (Signed)
YOU HAD AN ENDOSCOPIC PROCEDURE TODAY: Refer to the procedure report and other information in the discharge instructions given to you for any specific questions about what was found during the examination. If this information does not answer your questions, please call [facility] at [phone] to clarify.   YOU SHOULD EXPECT: Some feelings of bloating in the abdomen. Passage of more gas than usual. Walking can help get rid of the air that was put into your GI tract during the procedure and reduce the bloating. Some abdominal soreness may be present for a day or two, also.  DIET: Your first meal following the procedure should be a light meal and then it is ok to progress to your normal diet. A half-sandwich or bowl of soup is an example of a good first meal. Heavy or fried foods are harder to digest and may make you feel nauseous or bloated. Drink plenty of fluids but you should avoid alcoholic beverages for 24 hours.   ACTIVITY: Your care partner should take you home directly after the procedure. You should plan to take it easy, moving slowly for the rest of the day. You can resume normal activity the day after the procedure however YOU SHOULD NOT DRIVE, use power tools, machinery or perform tasks that involve climbing or major physical exertion for 24 hours (because of the sedation medicines used during the test).   SYMPTOMS TO REPORT IMMEDIATELY: A gastroenterologist can be reached at any hour. Please call [phone]  for any of the following symptoms:   Following upper endoscopy (EGD, EUS, ERCP, esophageal dilation) Vomiting of blood or coffee ground material  New, significant abdominal pain  New, significant chest pain or pain under the shoulder blades  Painful or persistently difficult swallowing  New shortness of breath  Black, tarry-looking or red, bloody stools  FOLLOW UP:  If any biopsies were taken you will be contacted by phone or by letter within the next 1-3 weeks. Call  [phone]  if you have not heard about the biopsies in 3 weeks.  Please also call with any specific questions about appointments or follow up tests.  

## 2024-07-10 ENCOUNTER — Encounter (HOSPITAL_COMMUNITY): Payer: Self-pay | Admitting: Gastroenterology

## 2024-07-10 NOTE — Progress Notes (Signed)
 lvm

## 2024-07-11 ENCOUNTER — Other Ambulatory Visit: Payer: Self-pay

## 2024-07-11 DIAGNOSIS — K8689 Other specified diseases of pancreas: Secondary | ICD-10-CM

## 2024-07-11 NOTE — Progress Notes (Signed)
 Patient sent a message requested referral to GI.  Referral placed.

## 2024-07-12 ENCOUNTER — Telehealth: Payer: Self-pay | Admitting: Internal Medicine

## 2024-07-12 NOTE — Telephone Encounter (Signed)
 Ok for appt, non-urgent JMP

## 2024-07-12 NOTE — Telephone Encounter (Signed)
 Good morning Dr. Albertus,   DOD for today 10/1 AM    Patient called stating that we had received a referral from her Oncologist to be seen for Pancreatic insufficiency. Patient had procedures with Dr. Rollin at [facility] on 9/26. She states she is a patient with [facility]. I advised her that we needed her gastro records and she states that if she request those records they will dismiss her from the practice. She is requesting to be seen with us  because [facility] does not know much about Pancreatic insufficiency. Patients records from procedure are in epic, will you please review and advise on scheduling patient?  Thank you.

## 2024-07-14 NOTE — Telephone Encounter (Signed)
 Patient scheduled.

## 2024-07-20 ENCOUNTER — Ambulatory Visit: Admitting: Internal Medicine

## 2024-07-20 ENCOUNTER — Encounter: Payer: Self-pay | Admitting: Internal Medicine

## 2024-07-20 VITALS — BP 98/66 | HR 98 | Ht 62.0 in | Wt 139.0 lb

## 2024-07-20 DIAGNOSIS — F064 Anxiety disorder due to known physiological condition: Secondary | ICD-10-CM

## 2024-07-20 DIAGNOSIS — Z800 Family history of malignant neoplasm of digestive organs: Secondary | ICD-10-CM | POA: Diagnosis not present

## 2024-07-20 DIAGNOSIS — R195 Other fecal abnormalities: Secondary | ICD-10-CM

## 2024-07-20 DIAGNOSIS — K529 Noninfective gastroenteritis and colitis, unspecified: Secondary | ICD-10-CM

## 2024-07-20 NOTE — Progress Notes (Signed)
 Patient ID: Kristi [name], female   DOB: 08-01-1976, 48 y.o.   MRN: [id number] HPI:  Kristi [name] is a 48 year old female with probable pancreatic insufficiency who presents with loose stools. She was referred by Dr. Tina for evaluation of her chronic diarrhea and pancreatic insufficiency.  She has experienced chronic diarrhea since mid-June 2025, with watery loose stools occurring one to three times daily, primarily in the morning. The diarrhea began after a sore throat treated with amoxicillin, which resolved after a month, but the diarrhea persisted. She has lost four to five pounds but has regained most of it by tracking her caloric intake. Cholestyramine four grams at night has helped control the diarrhea, reducing bowel movements to once or twice in the morning, with stools being more formed (type four to six). The diarrhea worsens around her menstrual period or ovulation.  She underwent an EUS on July 07, 2024, following up on her symptoms. Previous imaging included a CT scan of the abdomen and pelvis on May 26, 2024, and an abdominal ultrasound on March 24, 2024. Lab work from June 29, 2024, showed a low fecal elastase, with levels of 141 and 150 on two separate occasions, both on cholestyramine. She has not had tests for C diff, SIBO, bile acid malabsorption, or fecal fat.  Her family history is significant for a brother who had pancreatic cancer in his late fifties. She is concerned about the potential link between her symptoms and pancreatic cancer, given her family history.  She has a history of cholecystectomy in 2011 and has experienced intermittent IBS symptoms since then, depending on dietary intake. No blood in stools or significant pain associated with bowel movements. Reports fast transit time, with food passing through her system in about twelve hours.  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 She has recently started an anti-anxiety medication due to  anxiety related to her health concerns, particularly about pancreatic issues, and takes clonazepam intermittently for sleep disturbances.  She is really only taken buspirone once or twice so far.    She has 2 daughters 1 in college and the other a freshman at [employer]   Past Medical History:  Diagnosis Date   Allergies    Anxiety     Past Surgical History:  Procedure Laterality Date   CHOLECYSTECTOMY, LAPAROSCOPIC  2011   ESOPHAGOGASTRODUODENOSCOPY N/A 07/07/2024   Procedure: EGD (ESOPHAGOGASTRODUODENOSCOPY);  Surgeon: Rollin Dover, MD;  Location: [facility];  Service: Gastroenterology;  Laterality: N/A;   EUS N/A 07/07/2024   Procedure: ULTRASOUND, UPPER GI TRACT, ENDOSCOPIC;  Surgeon: Rollin Dover, MD;  Location: [facility];  Service: Gastroenterology;  Laterality: N/A;    Outpatient Medications Prior to Visit  Medication Sig Dispense Refill   busPIRone (BUSPAR) 5 MG tablet Take 5 mg by mouth daily.     cetirizine (ZYRTEC) 10 MG tablet Take 10 mg by mouth daily as needed for allergies.     cholestyramine (QUESTRAN) 4 g packet Take 1 packet by mouth daily.     clonazePAM (KLONOPIN) 1 MG tablet Take 1 mg by mouth 2 (two) times daily as needed.     fexofenadine (ALLEGRA ODT) 30 MG disintegrating tablet Take 30 mg by mouth daily. (Patient taking differently: Take 30 mg by mouth as needed.)     Fiber Select Gummies CHEW as directed Orally     ibuprofen (ADVIL) 200 MG tablet Take 200 mg by mouth as needed.     loratadine (CLARITIN) 10  MG tablet Take 10 mg by mouth daily as needed for allergies.     melatonin 3 MG TABS tablet Take 3 mg by mouth at bedtime as needed.     Multiple Vitamin (MULTIVITAMIN ADULT PO) Take 1 tablet by mouth daily.     diazepam (VALIUM) 5 MG tablet Take 5 mg by mouth every 6 (six) hours as needed for anxiety.     venlafaxine XR (EFFEXOR-XR) 37.5 MG 24 hr capsule Take 37.5 mg by mouth daily with breakfast. Hasn't started (Patient not taking: Reported  on 06/22/2024)     No facility-administered medications prior to visit.    Allergies  Allergen Reactions   Hydrocodone-Acetaminophen Itching   Other Itching    vicodin    Family History  Problem Relation Age of Onset   COPD Mother    Aortic aneurysm Father    Pancreatic cancer Brother 66 - 67   Colon cancer Maternal Grandmother 24 - 89   Stomach cancer Neg Hx    Esophageal cancer Neg Hx     Social History   Tobacco Use   Smoking status: Never   Smokeless tobacco: Never  Vaping Use   Vaping status: Never Used  Substance Use Topics   Drug use: Never    ROS: As per history of present illness, otherwise negative  BP 98/66   Pulse 98   Ht 5' 2 (1.575 m)   Wt 139 lb (63 kg)   LMP 07/02/2024 (Exact Date)   SpO2 98%   BMI 25.42 kg/m  Gen: awake, alert, NAD HEENT: anicteric  Abd: soft, NT/ND, +BS throughout Ext: no c/c/e Neuro: nonfocal   RELEVANT LABS AND IMAGING: CBC    Component Value Date/Time   WBC 11.4 (H) 06/29/2024 0811   WBC 16.7 (H) 01/24/2010 0345   RBC 4.38 06/29/2024 0811   HGB 14.5 06/29/2024 0811   HCT 42.3 06/29/2024 0811   PLT 322 06/29/2024 0811   MCV 96.6 06/29/2024 0811   MCH 33.1 06/29/2024 0811   MCHC 34.3 06/29/2024 0811   RDW 12.9 06/29/2024 0811   LYMPHSABS 1.7 06/29/2024 0811   MONOABS 0.6 06/29/2024 0811   EOSABS 0.0 06/29/2024 0811   BASOSABS 0.0 06/29/2024 0811    CMP     Component Value Date/Time   NA 140 06/29/2024 0811   K 3.9 06/29/2024 0811   CL 105 06/29/2024 0811   CO2 27 06/29/2024 0811   GLUCOSE 95 06/29/2024 0811   BUN 14 06/29/2024 0811   CREATININE 0.82 06/29/2024 0811   CALCIUM 9.8 06/29/2024 0811   PROT 7.5 06/29/2024 0811   ALBUMIN 4.4 06/29/2024 0811   AST 15 06/29/2024 0811   ALT 16 06/29/2024 0811   ALKPHOS 55 06/29/2024 0811   BILITOT 0.4 06/29/2024 0811   GFRNONAA >60 06/29/2024 0811   GFRAA  01/24/2010 0345    >60        The eGFR has been calculated using the MDRD equation. This  calculation has not been validated in all clinical situations. eGFR's persistently <60 mL/min signify possible Chronic Kidney Disease.    Results   LABS   CBC: WBC 11.4, Hb 14.5, MCV 96.6, PLT 322, Neutrophils 8.9 (06/29/2024)   Vitamin D : Normal (06/29/2024)   Vitamin K1 : Normal (06/29/2024)   Vitamin E: Normal (06/29/2024)   Vitamin A : Normal (06/29/2024)   Zinc : Normal (06/29/2024)   Magnesium: 2.2 (06/29/2024)   AST: 15 (06/29/2024)   ALT: 16 (06/29/2024)   Alkaline phosphatase: 55 (06/29/2024)  Total bilirubin: 0.4 (06/29/2024)   LDH: 131 (06/29/2024)   Fecal elastase: 150    RADIOLOGY   Abdominal ultrasound: No discrete cystic or solid masses at site of palpable concern in left upper quadrant (03/24/2024)   CT scan abdomen and pelvis: Negative, no evidence of pancreatic pathology or significant abnormality (05/26/2024)    DIAGNOSTIC   EUS: Normal pancreas status post cholecystectomy (07/07/2024)        ASSESSMENT/PLAN:  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 Imaging normal, reducing likelihood of pancreatic pathology despite family history. Nocturnal diarrhea and weight loss absent, lowering inflammatory bowel disease suspicion. No vitamin deficiencies noted. Discussed post-viral etiology and potential spontaneous resolution. - Order SIBO breath test. - Order fecal calprotectin test. - Order C. difficile test. - Order ova and parasite exam. - Repeat fecal elastase test. - Review colonoscopy records from Dr. Kristie. - Continue cholestyramine 4 grams at bedtime. - If elastase remains low and no other cause for diarrhea identified then would discontinue cholestyramine and give a trial of Creon - Follow up in January.     Family history of pancreatic cancer Normal EUS and CT -Consider repeat pancreatic imaging at age 34  Anxiety associated with a general medical  condition Significant anxiety with diarrhea, possible EPI and family history of pancreatic cancer.  Reassurance provided that we will completely evaluate this condition.  EPI is not associated with shorten lifespan and is typically very manageable.  Colon cancer screening She reports normal colonoscopy in 2024.  She reports biopsies were not done for microscopic colitis but my suspicion for microscopic colitis is low.  We would only consider repeat colonoscopy if fecal calprotectin was considerably elevated. -Request colonoscopy records from Dr. Kristie  65 minutes total spent today including patient facing time, coordination of care, reviewing medical history/procedures/pertinent radiology studies, and documentation of the encounter.

## 2024-07-20 NOTE — Patient Instructions (Signed)
 Your provider has requested that you go to the basement level for lab work before leaving today. Press B on the elevator. The lab is located at the first door on the left as you exit the elevator.  You have been given a testing kit to check for small intestine bacterial overgrowth (SIBO) which is completed by a company named [facility]. Make sure to return your test in the mail using the return mailing label given to you along with the kit. The test order, your demographic and insurance information have all already been sent to the company. [facility] will collect an upfront charge of $109.00 for commercial insurance plans and $229.00 if you are paying cash. The potential remaining total after claim submission and review is $120.00. Make sure to discuss with [facility] PRIOR to having the test to see if they have gotten information from your insurance company as to how much your testing will cost out of pocket, if any. Please contact [facility] at phone number [phone] to get instructions regarding how to perform the test as our office is unable to give specific testing instructions.   Continue cholestyramine 4 grams at bedtime.   _______________________________________________________  If your blood pressure at your visit was 140/90 or greater, please contact your primary care physician to follow up on this.  _______________________________________________________  If you are age 67 or older, your body mass index should be between 23-30. Your Body mass index is 25.42 kg/m. If this is out of the aforementioned range listed, please consider follow up with your Primary Care Provider.  If you are age 54 or younger, your body mass index should be between 19-25. Your Body mass index is 25.42 kg/m. If this is out of the aformentioned range listed, please consider follow up with your Primary Care Provider.   ________________________________________________________  The  [facility] providers would like to encourage you to use MYCHART to communicate with providers for non-urgent requests or questions.  Due to long hold times on the telephone, sending your provider a message by [place] [facility] may be a faster and more efficient way to get a response.  Please allow 48 business hours for a response.  Please remember that this is for non-urgent requests.  _______________________________________________________  [facility] is using a team-based approach to care.  Your team is made up of your doctor and two to three APPS. Our APPS (Nurse Practitioners and Physician Assistants) work with your physician to ensure care continuity for you. They are fully qualified to address your health concerns and develop a treatment plan. They communicate directly with your gastroenterologist to care for you. Seeing the Advanced Practice Practitioners on your physician's team can help you by facilitating care more promptly, often allowing for earlier appointments, access to diagnostic testing, procedures, and other specialty referrals.

## 2024-07-21 ENCOUNTER — Telehealth: Payer: Self-pay | Admitting: Internal Medicine

## 2024-07-21 ENCOUNTER — Encounter: Payer: Self-pay | Admitting: Internal Medicine

## 2024-07-21 NOTE — Telephone Encounter (Signed)
 Inbound call from patient stating that she got the ova and parasite exam and she was reading the instructions and it states about putting a liquid in the bile. She  there is not liquid ito put in the bile and is requesting a call to discuss if she needs to come get another  kit. Please advise.

## 2024-07-21 NOTE — Telephone Encounter (Signed)
 Called lab to clarify what is in the Ova and parasite kit. [facility] lab states the kit is a small vial with a black top that has a small amount of liquid in it. The lab said do not waste the liquid and do not go past the line with stool.   Called patient back and explained to patient that I contacted the lab to ask about the kits. Explained to patient what the lab said regarding the liquid in the vial and do not fill stool past the line on the vial. Patient states she cannot see any liquid in the vial. Informed patient that unfortunately I have not seen the kit in person to help explain what the kit is suppose to look like. Informed patient to take her kit to the lab to make sure she has everything she needs submit the stool sample. Informed patient that I do not want her to have to repeat the test. Patient agreed and will go by the lab today.

## 2024-07-24 ENCOUNTER — Other Ambulatory Visit

## 2024-07-24 ENCOUNTER — Encounter: Payer: Self-pay | Admitting: Internal Medicine

## 2024-07-24 DIAGNOSIS — K529 Noninfective gastroenteritis and colitis, unspecified: Secondary | ICD-10-CM

## 2024-07-25 LAB — CLOSTRIDIUM DIFFICILE BY PCR: Toxigenic C. Difficile by PCR: NEGATIVE

## 2024-07-26 ENCOUNTER — Ambulatory Visit: Payer: Self-pay | Admitting: Internal Medicine

## 2024-07-28 ENCOUNTER — Other Ambulatory Visit: Payer: Self-pay

## 2024-07-31 ENCOUNTER — Telehealth: Payer: Self-pay

## 2024-07-31 ENCOUNTER — Other Ambulatory Visit (HOSPITAL_COMMUNITY): Payer: Self-pay

## 2024-07-31 ENCOUNTER — Other Ambulatory Visit: Payer: Self-pay

## 2024-07-31 ENCOUNTER — Telehealth: Payer: Self-pay | Admitting: Internal Medicine

## 2024-07-31 DIAGNOSIS — K529 Noninfective gastroenteritis and colitis, unspecified: Secondary | ICD-10-CM

## 2024-07-31 DIAGNOSIS — R195 Other fecal abnormalities: Secondary | ICD-10-CM

## 2024-07-31 LAB — OVA AND PARASITE EXAMINATION
CONCENTRATE RESULT:: NONE SEEN
MICRO NUMBER:: 17090747
SPECIMEN QUALITY:: ADEQUATE
TRICHROME RESULT:: NONE SEEN

## 2024-07-31 LAB — PANCREATIC ELASTASE, FECAL: Pancreatic Elastase-1, Stool: 157 ug/g — ABNORMAL LOW (ref >200)

## 2024-07-31 LAB — CALPROTECTIN: Calprotectin: 99 ug/g

## 2024-07-31 MED ORDER — ZENPEP 40000-126000 UNITS PO CPEP: ORAL_CAPSULE | ORAL | 2 refills | Status: DC

## 2024-07-31 NOTE — Telephone Encounter (Signed)
 Let Kristi [name] know that I have prescribed Zenpep and sent her message. I am okay if she completes the rifaximin before starting the pancreatic enzymes. I do think the EPI diagnosis and low fecal elastase is correct but if she needs this for her own peace of mind we can repeat elastase 2 weeks after SIBO therapy completes. You can reassure her the rifaximin is effective for SIBO. JMP

## 2024-07-31 NOTE — Telephone Encounter (Signed)
 See MyChart message 07/28/24

## 2024-07-31 NOTE — Telephone Encounter (Signed)
 Inbound call from patient requesting a call to discuss prior authorization needed for XIFAXAN. States she was advised by her pharmacy that insurance usually denies medication coverage if the reason is for SIBO. States it could be covered under IBS or diarrhea diagnosis. Patient is requesting a call back. Please advise, thank you.

## 2024-07-31 NOTE — Telephone Encounter (Signed)
 PA request has been Submitted. New Encounter has been or will be created for follow up. For additional info see Pharmacy Prior Auth telephone encounter from 07-31-2024.

## 2024-07-31 NOTE — Telephone Encounter (Signed)
 Pharmacy Patient Advocate Encounter  Received notification from [facility] that Prior Authorization for Xifaxan 550MG  tablets has been APPROVED from 07-01-2024 to 08-14-2024. Ran test claim, Copay is $0.00. This test claim was processed through [facility]- copay amounts may vary at other pharmacies due to pharmacy/plan contracts, or as the patient moves through the different stages of their insurance plan.   PA #/Case ID/Reference #: [id number]

## 2024-07-31 NOTE — Telephone Encounter (Signed)
 Pharmacy Patient Advocate Encounter   Received notification from Pt Calls Messages that prior authorization for Xifaxan 550MG  tablets is required/requested.   Insurance verification completed.   The patient is insured through [employer].   Per test claim: PA required; PA submitted to above mentioned insurance via Latent Key/confirmation #/EOC [id number] Status is pending

## 2024-08-03 NOTE — Telephone Encounter (Signed)
 Alan, do we have a copy of her breath test to see if she has a mixed type SIBO? I do not see it has been scanned yet JMP

## 2024-08-04 ENCOUNTER — Telehealth: Payer: Self-pay | Admitting: Internal Medicine

## 2024-08-04 NOTE — Telephone Encounter (Signed)
 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  However she is predominantly hydrogen. We can try to add neomycin 500 mg twice daily for 14 days to the rifaximin.  At times this has been difficult to obtain but we can see if it is available at her pharmacy.  Please notify patient

## 2024-08-04 NOTE — Telephone Encounter (Signed)
 Inbound call from patient requesting a call to discuss previous note further. Please advise, thank you.

## 2024-08-04 NOTE — Telephone Encounter (Signed)
 Patient stating she is concern about neomycin. States [facility] needs prior serbia. Stated she is also unsure if they will have medication in stock. Please advise, thank you.

## 2024-08-05 ENCOUNTER — Telehealth: Payer: Self-pay | Admitting: Nurse Practitioner

## 2024-08-05 MED ORDER — NEOMYCIN SULFATE 500 MG PO TABS: 500.0000 mg | ORAL_TABLET | Freq: Two times a day (BID) | ORAL | 0 refills | Status: DC

## 2024-08-05 NOTE — Telephone Encounter (Signed)
 48 year old female with history of pancreatic insufficiency, known by Dr. Albertus called our on-call weekend GI service 08/05/2024.On Xifanan for SIBO x 4 days. Yesterday, said Dr. Albertus was going to call in Neomycin but she has not received RX. Soiled self yellow stool with mucous.

## 2024-08-06 NOTE — Telephone Encounter (Signed)
 Addendum to note below. I sent in RX for Neomycin as recommended per Dr. Albertus, refer to his mychart message. Patient to contact office if not tolerated or if her nonbloody mucous production form the rectum or if fecal incontinence recurs.

## 2024-08-10 ENCOUNTER — Telehealth: Payer: Self-pay | Admitting: Internal Medicine

## 2024-08-10 DIAGNOSIS — K529 Noninfective gastroenteritis and colitis, unspecified: Secondary | ICD-10-CM

## 2024-08-10 DIAGNOSIS — R195 Other fecal abnormalities: Secondary | ICD-10-CM

## 2024-08-10 NOTE — Telephone Encounter (Signed)
 Patient calls stating that she is taking Xifaxan and neomycin for SIBO and has been on both medications for several days. States she is having 5-6 yellow, watery bowel movements every morning but this tends to subside after a few hours. States she has this weird sensation under my left rib cage but it isnt pain and it comes and goes. +nausea but no vomiting.   Patient has multiple questions about SIBO and when she should retest for SIBO. Also states that Dr Albertus told her SIBO can cause false results for fecal calprotectin and fecal elastase so she thinks she should retest for these as well. Asks if she can be tested for H Pylori since she has read that H Pylori can be the cause of SIBO. Patient asks if she should be taking anything following antibiotics because she hears that SIBO can return-also wants to know how often SIBO returns. She is advised that this is individualized with no specific time frame.  Patient is instructed that since her symptoms have not improved on antibiotics, she may go ahead and start Zenpep 2 capsules with meals and 1 capsule with snacks as this may be helpful in slowing/stopping her diarrhea (was originally holding off on starting Zenpep until after antibiotics).  Dr Albertus, please advise.

## 2024-08-10 NOTE — Telephone Encounter (Signed)
 Inbound call from patient stating she was given antibiotics for sibo and patient states she's been having a hard time due to having diarrhea every morning and pain in stomach. Requesting a call back Please advise  Thank you

## 2024-08-10 NOTE — Telephone Encounter (Signed)
Advised patient of Dr Vena Rua recommendations. She verbalizes understanding.

## 2024-08-10 NOTE — Telephone Encounter (Signed)
 Thank you Dottie, agree with your recommendations Would not retest for SIBO until at least a month after antibiotics Okay to test for H. pylori by stool antigen

## 2024-08-10 NOTE — Telephone Encounter (Signed)
 Retest fecal calprotectin and fecal elastase as well or unnecessary?

## 2024-08-10 NOTE — Telephone Encounter (Signed)
 I would not retest fecal calprotectin or fecal elastase until 1 month after antibiotic completion They can be repeated at that time but should be repeated on Zenpep; fecal elastase value will not change with pancreatic enzyme replacement

## 2024-08-14 ENCOUNTER — Encounter: Payer: Self-pay | Admitting: Internal Medicine

## 2024-08-16 ENCOUNTER — Encounter: Payer: Self-pay | Admitting: Internal Medicine

## 2024-09-10 ENCOUNTER — Encounter: Payer: Self-pay | Admitting: Internal Medicine

## 2024-09-10 DIAGNOSIS — R197 Diarrhea, unspecified: Secondary | ICD-10-CM

## 2024-09-11 NOTE — Telephone Encounter (Signed)
 Have patient go ahead and repeat her SIBO breath test Please get her an appointment with me in January or February --we can discuss SIBO and her other symptoms at that time She can come for B12, ferritin plus IBC blood test

## 2024-09-12 NOTE — Telephone Encounter (Signed)
 Patient is requesting a call to discuss previous note further. Please advise, thank you

## 2024-09-14 ENCOUNTER — Other Ambulatory Visit (INDEPENDENT_AMBULATORY_CARE_PROVIDER_SITE_OTHER)

## 2024-09-14 DIAGNOSIS — R197 Diarrhea, unspecified: Secondary | ICD-10-CM

## 2024-09-14 LAB — IBC + FERRITIN
Ferritin: 29.7 ng/mL (ref 10.0–291.0)
Iron: 143 ug/dL (ref 42–145)
Saturation Ratios: 38.8 % (ref 20.0–50.0)
TIBC: 368.2 ug/dL (ref 250.0–450.0)
Transferrin: 263 mg/dL (ref 212.0–360.0)

## 2024-09-14 LAB — VITAMIN B12: Vitamin B-12: 357 pg/mL (ref 211–911)

## 2024-09-15 ENCOUNTER — Ambulatory Visit: Payer: Self-pay | Admitting: Internal Medicine

## 2024-09-18 ENCOUNTER — Telehealth: Payer: Self-pay | Admitting: *Deleted

## 2024-09-18 ENCOUNTER — Encounter: Payer: Self-pay | Admitting: Internal Medicine

## 2024-09-18 NOTE — Telephone Encounter (Signed)
 I was calling the patient when Rock forwarded patient's MyChart message. I had plugged her in to see Deanna tomorrow as patient  is very anxious to see someone to discuss in person what is going on. Please advise if ok for her to keep appointment tomorrow.

## 2024-09-19 ENCOUNTER — Ambulatory Visit: Admitting: Gastroenterology

## 2024-09-19 ENCOUNTER — Encounter: Payer: Self-pay | Admitting: Gastroenterology

## 2024-09-19 VITALS — BP 100/60 | HR 76 | Ht 62.0 in | Wt 137.0 lb

## 2024-09-19 DIAGNOSIS — K529 Noninfective gastroenteritis and colitis, unspecified: Secondary | ICD-10-CM

## 2024-09-19 DIAGNOSIS — K638219 Small intestinal bacterial overgrowth, unspecified: Secondary | ICD-10-CM

## 2024-09-19 DIAGNOSIS — F064 Anxiety disorder due to known physiological condition: Secondary | ICD-10-CM

## 2024-09-19 DIAGNOSIS — Z800 Family history of malignant neoplasm of digestive organs: Secondary | ICD-10-CM

## 2024-09-19 DIAGNOSIS — K8681 Exocrine pancreatic insufficiency: Secondary | ICD-10-CM

## 2024-09-19 NOTE — Progress Notes (Signed)
 Chief Complaint:follow-up Primary GI Doctor:Dr. Albertus  HPI: Kristi [name] is a 48 year old female with probable pancreatic insufficiency who presents with loose stools. She was referred by Dr. Tina for evaluation of her chronic diarrhea and pancreatic insufficiency.   GI history: She has experienced chronic diarrhea since mid-June 2025, with watery loose stools occurring one to three times daily, primarily in the morning. The diarrhea began after a sore throat treated with amoxicillin, which resolved after a month, but the diarrhea persisted. She has lost four to five pounds but has regained most of it by tracking her caloric intake. Cholestyramine four grams at night has helped control the diarrhea, reducing bowel movements to once or twice in the morning, with stools being more formed (type four to six). The diarrhea worsens around her menstrual period or ovulation.   She underwent an EUS on July 07, 2024, following up on her symptoms. Previous imaging included a CT scan of the abdomen and pelvis on May 26, 2024, and an abdominal ultrasound on March 24, 2024. Lab work from June 29, 2024, showed a low fecal elastase, with levels of 141 and 150 on two separate occasions, both on cholestyramine. She has not had tests for C diff, SIBO, bile acid malabsorption, or fecal fat.   Her family history is significant for a brother who had pancreatic cancer in his late fifties. She is concerned about the potential link between her symptoms and pancreatic cancer, given her family history.   She has a history of cholecystectomy in 2011 and has experienced intermittent IBS symptoms since then, depending on dietary intake. No blood in stools or significant pain associated with bowel movements. Reports fast transit time, with food passing through her system in about twelve hours.  06/22/24 seen by oncology for leukocytosis. Clinically without any palpable mass or lymphadenopathy, or other concerning  symptoms. Discussed repeat labs and follow-up.   Interval History Patient was seen in GI office on 07/20/24 by Dr. Albertus.  Patient presents to discuss her most recent diagnosis of SIBO and exocrine pancreatic insufficiency.  Patient has been battling issues with diarrhea for the last several months. Dr. Albertus treated her for SIBO with Xifaxan and neomycin .  Patient reports she had a lot of issues with taking the medication and felt as if her symptoms was worse.  She reports about two weeks after she completed the treatment she felt better but did not last very long.  Patient was started on Zenpep  2 capsules with meals and 1 capsule with snacks for EPI. She was having 3-4 (sometimes more) loose yellow/orange stools every morning. She reports this happens before she eats. She typically has three spread out meals and one snack. No late night snacking. Patient wanted to be retested for Sibo which was ordered and recently completed.  She was off the Zenpep  for 3 days for the Sibo test. She reports the lactulose gave her horrendous diarrhea.  She restarted the Zenpep  two days ago and stools have improved somewhat to BSS 5. She is following a very strict Low fodmap diet and uses phone App [employer] to help with groceries.   She enquires today about retesting her abnormal stool tests.  She also enquires about adding probiotics and/or herbal supplements. She enquires about seeing a holistic homeopathic doctor.   Wt Readings from Last 3 Encounters:  09/19/24 137 lb (62.1 kg)  07/20/24 139 lb (63 kg)  07/07/24 135 lb (61.2 kg)   Past Medical History:  Diagnosis Date  Allergies    Anxiety    Small intestinal bacterial overgrowth (SIBO)    Past Surgical History:  Procedure Laterality Date   CHOLECYSTECTOMY, LAPAROSCOPIC  2011   ESOPHAGOGASTRODUODENOSCOPY N/A 07/07/2024   Procedure: EGD (ESOPHAGOGASTRODUODENOSCOPY);  Surgeon: Rollin Dover, MD;  Location: [facility];  Service: Gastroenterology;   Laterality: N/A;   EUS N/A 07/07/2024   Procedure: ULTRASOUND, UPPER GI TRACT, ENDOSCOPIC;  Surgeon: Rollin Dover, MD;  Location: [facility];  Service: Gastroenterology;  Laterality: N/A;    Current Outpatient Medications  Medication Sig Dispense Refill   busPIRone (BUSPAR) 5 MG tablet Take 5 mg by mouth daily.     cetirizine (ZYRTEC) 10 MG tablet Take 10 mg by mouth daily as needed for allergies.     clonazePAM (KLONOPIN) 1 MG tablet Take 1 mg by mouth 2 (two) times daily as needed.     melatonin 3 MG TABS tablet Take 3 mg by mouth at bedtime as needed.     Multiple Vitamin (MULTIVITAMIN ADULT PO) Take 1 tablet by mouth daily.     Pancrelipase , Lip-Prot-Amyl, (ZENPEP ) 40000-126000 units CPEP Take 2 capsules (80,000 Units total) by mouth 2 (two) times daily with a meal AND 1 capsule (40,000 Units total) with snacks. Maximum 2 snacks daily; maximum number of capsules per day: 8. 300 capsule 2   No current facility-administered medications for this visit.    Allergies as of 09/19/2024 - Review Complete 09/19/2024  Allergen Reaction Noted   Hydrocodone-acetaminophen Itching 07/20/2024   Other Itching 07/20/2024    Family History  Problem Relation Age of Onset   COPD Mother    Aortic aneurysm Father    Pancreatic cancer Brother 54 - 32   Colon cancer Maternal Grandmother 23 - 89   Stomach cancer Neg Hx    Esophageal cancer Neg Hx     Review of Systems:    Constitutional: No weight loss, fever, chills, weakness or fatigue HEENT: Eyes: No change in vision               Ears, Nose, Throat:  No change in hearing or congestion Skin: No rash or itching Cardiovascular: No chest pain, chest pressure or palpitations   Respiratory: No SOB or cough Gastrointestinal: See HPI and otherwise negative Genitourinary: No dysuria or change in urinary frequency Neurological: No headache, dizziness or syncope Musculoskeletal: No new muscle or joint pain Hematologic: No bleeding or  bruising Psychiatric: No history of depression or anxiety    Physical Exam:  Vital signs: BP 100/60   Pulse 76   Ht 5' 2 (1.575 m)   Wt 137 lb (62.1 kg)   BMI 25.06 kg/m   Constitutional:   Pleasant  female appears to be in NAD, Well developed, Well nourished, alert and cooperative Eyes:   PEERL, EOMI. No icterus. Conjunctiva pink. Neck:  Supple Throat: Oral cavity and pharynx without inflammation, swelling or lesion.  Respiratory: Respirations even and unlabored. Lungs clear to auscultation bilaterally.   No wheezes, crackles, or rhonchi.  Cardiovascular: Normal S1, S2. Regular rate and rhythm. No peripheral edema, cyanosis or pallor.  Gastrointestinal:  Soft, nondistended, nontender. No rebound or guarding. Normal bowel sounds. No appreciable masses or hepatomegaly. Rectal:  Not performed.  Msk:  Symmetrical without gross deformities. Without edema, no deformity or joint abnormality.  Neurologic:  Alert and  oriented x4;  grossly normal neurologically.  Skin:   Dry and intact without significant lesions or rashes.  RELEVANT LABS AND IMAGING: CBC    Latest Ref  Rng & Units 06/29/2024    8:11 AM 01/24/2010    3:45 AM 10/08/2009    5:13 AM  CBC  WBC 4.0 - 10.5 K/uL 11.4  16.7  16.6   Hemoglobin 12.0 - 15.0 g/dL 85.4  86.3  87.8   Hematocrit 36.0 - 46.0 % 42.3  38.2  35.5   Platelets 150 - 400 K/uL 322  293  294      CMP     Latest Ref Rng & Units 06/29/2024    8:11 AM 01/24/2010    3:45 AM  CMP  Glucose 70 - 99 mg/dL 95  883   BUN 6 - 20 mg/dL 14  15   Creatinine 9.55 - 1.00 mg/dL 9.17  9.21   Sodium 864 - 145 mmol/L 140  141   Potassium 3.5 - 5.1 mmol/L 3.9  3.4   Chloride 98 - 111 mmol/L 105  107   CO2 22 - 32 mmol/L 27  27   Calcium 8.9 - 10.3 mg/dL 9.8  9.7   Total Protein 6.5 - 8.1 g/dL 7.5  7.3   Total Bilirubin 0.0 - 1.2 mg/dL 0.4  0.8   Alkaline Phos 38 - 126 U/L 55  86   AST 15 - 41 U/L 15  118   ALT 0 - 44 U/L 16  73     LABS : CBC: WBC 11.4, Hb 14.5,  MCV 96.6, PLT 322, Neutrophils 8.9 (06/29/2024)   Vitamin D : Normal (06/29/2024)   Vitamin K1 : Normal (06/29/2024)   Vitamin E: Normal (06/29/2024)   Vitamin A : Normal (06/29/2024)   Zinc : Normal (06/29/2024)   Magnesium: 2.2 (06/29/2024)   AST: 15 (06/29/2024)   ALT: 16 (06/29/2024)   Alkaline phosphatase: 55 (06/29/2024)   Total bilirubin: 0.4 (06/29/2024)   LDH: 131 (06/29/2024)   Fecal elastase: 150    08/04/24 SIBO breath test- does have a mixed hydrogen and methane SIBO.  However she is predominantly hydrogen.  07/24/24 fecal calprotectin 99 07/24/24 C. difficile test- negative 07/24/24 ova and parasite -negative 07/24/24 fecal elastase - 157 09/14/24 labs show: iron 143, ferritin 29.7, b 12 357   RADIOLOGY   Abdominal ultrasound: No discrete cystic or solid masses at site of palpable concern in left upper quadrant (03/24/2024)   CT scan abdomen and pelvis: Negative, no evidence of pancreatic pathology or significant abnormality (05/26/2024)     DIAGNOSTIC   EUS: Normal pancreas status post cholecystectomy (07/07/2024)     04/2023 colonoscopy with Dr. Kristie The entire examined colon normal The examined portion of the ileum was normal Internal hemorrhoids No specimens collected   Assessment/Plan: Encounter Diagnoses  Name Primary?   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 Imaging normal, reducing likelihood of pancreatic pathology despite family history. Nocturnal diarrhea and fecal cal protectin 99, lowering inflammatory bowel disease suspicion. No vitamin deficiencies noted. Discussed post-viral etiology and potential spontaneous resolution. Treated  for Sibo, no noticeable improvement. Pt requested repeat SIBO test, pending results. Improvement seen with starting Zenpep .  -continue low fodmap diet -continue Zenpep  with meals and snacks -pending Sibo test  -can start psyllium husk to se eif it helps with consistency -hold off on probiotics or other herbal  supplements -hold off on stool elastase and fecal calprotectin   Family history of pancreatic cancer Normal EUS and CT -Consider repeat pancreatic imaging at age 40  Anxiety associated with a general medical condition Significant anxiety with diarrhea, possible EPI and family history of pancreatic cancer.  Reassurance provided that we will completely evaluate this condition.  EPI is not associated with shorten lifespan and is typically very manageable.  Colon cancer screening , colonoscopy 04/2023 with Dr. Kristie and normal. No biopsies taken.  -follow-up in January with Dr. Albertus   Thank you for the courtesy of this consult. Please call me with any questions or concerns.   Kalley Nicholl, FNP-C [facility] 09/19/2024, 12:28 PM  Cc: No ref. provider found

## 2024-09-19 NOTE — Patient Instructions (Addendum)
 Exocrine pancreatic insufficiency Continue Zenpep  2 capsules with meals, 1 with snacks   Altered bowel habits  start psyllium husk 1 tsp po daily  See if this helps with consistency  Sibo Recommend Low fodmap diet  Awaiting test results , will call you   _______________________________________________________  If your blood pressure at your visit was 140/90 or greater, please contact your primary care physician to follow up on this.  _______________________________________________________  If you are age 30 or older, your body mass index should be between 23-30. Your Body mass index is 25.06 kg/m. If this is out of the aforementioned range listed, please consider follow up with your Primary Care Provider.  If you are age 29 or younger, your body mass index should be between 19-25. Your Body mass index is 25.06 kg/m. If this is out of the aformentioned range listed, please consider follow up with your Primary Care Provider.   ________________________________________________________  The [facility] providers would like to encourage you to use MYCHART to communicate with providers for non-urgent requests or questions.  Due to long hold times on the telephone, sending your provider a message by [facility] may be a faster and more efficient way to get a response.  Please allow 48 business hours for a response.  Please remember that this is for non-urgent requests.  _______________________________________________________  [facility] is using a team-based approach to care.  Your team is made up of your doctor and two to three APPS. Our APPS (Nurse Practitioners and Physician Assistants) work with your physician to ensure care continuity for you. They are fully qualified to address your health concerns and develop a treatment plan. They communicate directly with your gastroenterologist to care for you. Seeing the Advanced Practice Practitioners on your physician's team can help you by  facilitating care more promptly, often allowing for earlier appointments, access to diagnostic testing, procedures, and other specialty referrals.   Thank you for trusting me with your gastrointestinal care. Deanna May, FNP-C

## 2024-09-22 ENCOUNTER — Encounter: Payer: Self-pay | Admitting: Internal Medicine

## 2024-09-22 NOTE — Telephone Encounter (Signed)
 Please let patient know that we do have her aero diagnostics small intestinal overgrowth report She does still have elevated increased hydrogen, her methane is now normal This is consistent with bacterial overgrowth but to a lesser degree than previous testing. We can provide her a copy of the report but then scanned the report for completeness.  She has improved with adding back Zenpep  and psyllium husk's which she should continue We can repeat fecal elastase on Zenpep  at her convenience  I would retreat for SIBO at this time with Augmentin 875 mg twice daily x 10 days I would not recommend a probiotic during this treatment C. difficile is always a risk with antibiotics but the benefit of treating her SIBO is felt to outweigh this risk  Some patients do require alternating antibiotics for 1 to 2 weeks every month but I do not think this will be her.  I do think the EPI is a confounding issue which causes symptoms and that is the reason for continuing Zenpep   Thank you

## 2024-09-25 ENCOUNTER — Telehealth: Payer: Self-pay

## 2024-09-25 ENCOUNTER — Other Ambulatory Visit (HOSPITAL_COMMUNITY): Payer: Self-pay

## 2024-09-25 MED ORDER — RIFAXIMIN 550 MG PO TABS: 550.0000 mg | ORAL_TABLET | Freq: Three times a day (TID) | ORAL | 0 refills | Status: DC

## 2024-09-25 MED ORDER — AMOXICILLIN-POT CLAVULANATE 875-125 MG PO TABS: 1.0000 | ORAL_TABLET | Freq: Two times a day (BID) | ORAL | 0 refills | Status: DC

## 2024-09-25 NOTE — Telephone Encounter (Signed)
 Given early recurrence and patient preference rifaximin  can be tried again.  There is no concrete data to support this decision. If it recurs in short order then we would definitely need to change antibiotics Rifaximin  550 mg 3 times daily x 14 days Cancel prescription for Augmentin 

## 2024-09-25 NOTE — Addendum Note (Signed)
 Addended by: CLAUDENE NAOMIE SAILOR on: 09/25/2024 02:11 PM   Modules accepted: Orders

## 2024-09-25 NOTE — Telephone Encounter (Signed)
 Pharmacy Patient Advocate Encounter   Received notification from Patient Pharmacy that prior authorization for Xifaxan  550MG  tablets is required/requested.   Insurance verification completed.   The patient is insured through [employer].   Prior Authorization for Xifaxan  550MG  tablets has been APPROVED from 09-25-2024 to 10-09-2024   PA #/Case ID/Reference #: [id number]

## 2024-09-27 ENCOUNTER — Telehealth: Payer: Self-pay | Admitting: Internal Medicine

## 2024-09-27 NOTE — Telephone Encounter (Signed)
 Spoke w pt about receiving recent test results. Pt requesting most recent SIBO breath testing. Pt also stated that she was informed not to take probiotics during treatment and asked if she could still eat yogurt. Please advise thank you.

## 2024-10-18 ENCOUNTER — Encounter: Payer: Self-pay | Admitting: Internal Medicine

## 2024-10-19 NOTE — Progress Notes (Unsigned)
 [facility] OFFICE PROGRESS NOTE  Patient Care Team: Signa Dire, MD (Inactive) as PCP - General (Family Medicine)   48 y.o.female with history of hypothyroidism, hyperlipidemia, IBS, endometriosis being seen for leukocytosis.  Assessment & Plan   No orders of the defined types were placed in this encounter.    Kristi JAYSON Chihuahua, MD  INTERVAL HISTORY: Patient returns for follow-up.  Oncology History   No problem history exists.     PHYSICAL EXAMINATION: ECOG PERFORMANCE STATUS: {CHL ONC ECOG PS:[id number]}  There were no vitals filed for this visit. There were no vitals filed for this visit.  Relevant data reviewed during this visit included ***

## 2024-10-20 ENCOUNTER — Inpatient Hospital Stay

## 2024-10-24 ENCOUNTER — Telehealth: Payer: Self-pay

## 2024-10-24 NOTE — Telephone Encounter (Signed)
 Patient has been made aware that her appointment was changed. Arland Legions BSN RN

## 2024-10-25 ENCOUNTER — Encounter: Payer: Self-pay | Admitting: Thoracic Surgery (Cardiothoracic Vascular Surgery)

## 2024-10-29 NOTE — Progress Notes (Unsigned)
 [facility] OFFICE PROGRESS NOTE  Patient Care Team: Signa Dire, MD (Inactive) as PCP - General (Family Medicine)   49 y.o.female with history of hypothyroidism, hyperlipidemia, IBS, endometriosis being seen for leukocytosis.  Assessment & Plan   No orders of the defined types were placed in this encounter.    Pauletta JAYSON Chihuahua, MD  INTERVAL HISTORY: Patient returns for follow-up.  Oncology History   No problem history exists.     PHYSICAL EXAMINATION: ECOG PERFORMANCE STATUS: {CHL ONC ECOG [id number]}  There were no vitals filed for this visit. There were no vitals filed for this visit.  Relevant data reviewed during this visit included ***

## 2024-10-30 ENCOUNTER — Inpatient Hospital Stay

## 2024-10-30 ENCOUNTER — Ambulatory Visit (HOSPITAL_COMMUNITY)
Admission: RE | Admit: 2024-10-30 | Discharge: 2024-10-30 | Disposition: A | Discharge status: Home / Self Care | Source: Ambulatory Visit

## 2024-10-30 VITALS — BP 125/80 | HR 81 | Temp 97.9°F | Resp 18 | Wt 136.4 lb

## 2024-10-30 DIAGNOSIS — D72829 Elevated white blood cell count, unspecified: Secondary | ICD-10-CM | POA: Diagnosis not present

## 2024-10-30 DIAGNOSIS — N951 Menopausal and female climacteric states: Secondary | ICD-10-CM | POA: Insufficient documentation

## 2024-10-30 DIAGNOSIS — R790 Abnormal level of blood mineral: Secondary | ICD-10-CM | POA: Diagnosis not present

## 2024-10-30 DIAGNOSIS — D72828 Other elevated white blood cell count: Secondary | ICD-10-CM

## 2024-10-30 DIAGNOSIS — N926 Irregular menstruation, unspecified: Secondary | ICD-10-CM | POA: Insufficient documentation

## 2024-10-30 DIAGNOSIS — R197 Diarrhea, unspecified: Secondary | ICD-10-CM | POA: Insufficient documentation

## 2024-10-30 DIAGNOSIS — R0989 Other specified symptoms and signs involving the circulatory and respiratory systems: Secondary | ICD-10-CM | POA: Insufficient documentation

## 2024-10-30 DIAGNOSIS — K638219 Small intestinal bacterial overgrowth, unspecified: Secondary | ICD-10-CM | POA: Insufficient documentation

## 2024-10-30 LAB — CBC WITH DIFFERENTIAL (CANCER CENTER ONLY)
Abs Immature Granulocytes: 0.03 K/uL (ref 0.00–0.07)
Basophils Absolute: 0 K/uL (ref 0.0–0.1)
Basophils Relative: 0 %
Eosinophils Absolute: 0 K/uL (ref 0.0–0.5)
Eosinophils Relative: 0 %
HCT: 42.4 % (ref 36.0–46.0)
Hemoglobin: 14.5 g/dL (ref 12.0–15.0)
Immature Granulocytes: 0 %
Lymphocytes Relative: 20 %
Lymphs Abs: 2.3 K/uL (ref 0.7–4.0)
MCH: 32.9 pg (ref 26.0–34.0)
MCHC: 34.2 g/dL (ref 30.0–36.0)
MCV: 96.1 fL (ref 80.0–100.0)
Monocytes Absolute: 0.6 K/uL (ref 0.1–1.0)
Monocytes Relative: 5 %
Neutro Abs: 8.7 K/uL — ABNORMAL HIGH (ref 1.7–7.7)
Neutrophils Relative %: 75 %
Platelet Count: 323 K/uL (ref 150–400)
RBC: 4.41 MIL/uL (ref 3.87–5.11)
RDW: 12.8 % (ref 11.5–15.5)
WBC Count: 11.7 K/uL — ABNORMAL HIGH (ref 4.0–10.5)
nRBC: 0 % (ref 0.0–0.2)

## 2024-10-30 NOTE — Assessment & Plan Note (Addendum)
 Along with low normal B12 and ferritin level.  With SIBO and intermittent diarrhea possibly malabsorptive.  Will repeat in 3 months.

## 2024-10-30 NOTE — Assessment & Plan Note (Addendum)
 Borderline stable.  Currently having SIBO undergoing treatment.  Will repeat in 3 months.

## 2024-11-02 ENCOUNTER — Encounter: Payer: Self-pay | Admitting: Internal Medicine

## 2024-11-02 ENCOUNTER — Ambulatory Visit: Admitting: Internal Medicine

## 2024-11-02 ENCOUNTER — Other Ambulatory Visit

## 2024-11-02 VITALS — BP 100/82 | HR 94 | Ht 62.0 in | Wt 137.0 lb

## 2024-11-02 DIAGNOSIS — K638219 Small intestinal bacterial overgrowth, unspecified: Secondary | ICD-10-CM

## 2024-11-02 DIAGNOSIS — K8681 Exocrine pancreatic insufficiency: Secondary | ICD-10-CM | POA: Diagnosis not present

## 2024-11-02 DIAGNOSIS — Z800 Family history of malignant neoplasm of digestive organs: Secondary | ICD-10-CM

## 2024-11-02 DIAGNOSIS — K580 Irritable bowel syndrome with diarrhea: Secondary | ICD-10-CM

## 2024-11-02 NOTE — Progress Notes (Signed)
 "  Subjective:    Patient ID: Kristi [name], female    DOB: December 12, 1975, 49 y.o.   MRN: [id number]  HPI Kristi [name] is a 49 year old female with SIBO, exocrine pancreatic insufficiency, IBS-D, and GERD who presents for follow-up of gastrointestinal symptoms.  Complex gastrointestinal history includes SIBO, exocrine pancreatic insufficiency, and IBS-D. Repeat SIBO breath test on 09/17/24 showed elevated hydrogen at 26 ppm (normal <15), normal methane at 7 ppm, and combined hydrogen/methane of 33 ppm. Two courses of rifaximin  550 mg TID for 14 days completed, with the most recent course finished 22 days prior to this visit. Significant improvement and stability in gastrointestinal symptoms for 1-2 weeks after rifaximin , followed by a trend toward instability. Approximately 70% of days since the last course have been stable; remaining days characterized by looser stools (type 5-6) and 3-4 days of diarrhea. Stool studies were submitted prior to this visit.  Fecal elastase was 157 (mild to moderate range) on 07/24/24. Currently taking Zenpep , dosing two capsules with meals and one with snacks, and tracking dietary fat intake (10-15 grams per snack, 20-25 grams per meal). Zenpep  dosing is adjusted based on fat content. High-fat foods such as ice cream and pizza are avoided due to postprandial symptoms. Psyllium supplementation has been helpful. Also takes a multivitamin with Vit D and wonders if addition vitamin D  is neededy. No use of probiotics, yeast, glutamine, or carnosine. No bowel surgery except for cholecystectomy, after which ongoing issues with dietary fat tolerance have persisted.  Bowel habits are variable, with 1-3 stools per day, typically starting formed and becoming looser as the day progresses. Days with type 5 stools and occasional type 6 stools, especially after higher fat intake. Follows a low FODMAP diet, which is easy to maintain and not overly restrictive, and avoids apples. Denies  bloating and gas, stating I've never had bloating. I've never had gas. The only thing I've ever had is diarrhea in the morning. Morning diarrhea is associated with weakness and dehydration for the rest of the day. Feels better and more stable after rifaximin , though symptoms worsen while on the medication. Anxiety and stress exacerbate symptoms.  The patient has a history of chronic GERD and takes daily medication for symptom control. No new symptoms or complications related to GERD.  Family history of pancreatic cancer in her brother, who also smoked, drank, and was overweight. Expresses concern about her own risk for cancer due to EPI and family history. Previous pancreatic imaging performed. Monitored by hematology for a persistently mildly elevated white count, with reassurance that this is not concerning. Currently takes Buspirone 5 mg in the morning and at 4-5 pm for anxiety, and clonazepam as needed for sleep. Anxiety is present and managed with medication, and can worsen gastrointestinal symptoms. Planning a family trip to [place] for spring break and expresses concern about managing her diet while traveling.   Review of Systems As per HPI, otherwise negative  Current Medications, Allergies, Past Medical History, Past Surgical History, Family History and Social History were reviewed in [employer] record.    Objective:   Physical Exam BP 100/82   Pulse 94   Ht 5' 2 (1.575 m)   Wt 137 lb (62.1 kg)   BMI 25.06 kg/m  Gen: awake, alert, NAD HEENT: anicteric  CV: RRR, no mrg Pulm: CTA b/l Abd: soft, NT/ND, +BS throughout Ext: no c/c/e Neuro: nonfocal   Labs Vitamin B12 (09/14/2024): Within normal limits Iron studies (09/14/2024): Within  normal limits; ferritin 29, TIBC 368, percent saturation 38.8 Fecal elastase (07/24/2024): 157, mild to moderate range Vitamin D : 37, low normal CBC (10/31/2024): Hemoglobin 11.7, neutrophils 8.x, white count  fluctuates  Diagnostic SIBO breath test (09/17/2024): Hydrogen 26 ppm, methane 7 ppm, combined hydrogen and methane 33 ppm (reference: <15 normal)  CT scan abdomen and pelvis: Negative, no evidence of pancreatic pathology or significant abnormality (05/26/2024)     DIAGNOSTIC   EUS: Normal pancreas status post cholecystectomy (07/07/2024)   Colonoscopy 04/23/2023 screening Normal to the terminal ileum    Assessment & Plan:   Small intestinal bacterial overgrowth (SIBO) Symptoms well-managed post-rifaximin  x 2 with intermittent loose stools. Recurrence can occur over time, empiric retreatment may be needed for urgent morning diarrhea. - Advised against repeat SIBO breath testing unless symptoms recur. - Provided guidance on recurrence and future empiric rifaximin  therapy for urgent morning diarrhea. - Instructed to contact office if symptoms recur for empiric retreatment.  Exocrine pancreatic insufficiency (EPI) Mild to moderate EPI confirmed by fecal elastase, partial improvement on Zenpep  40K. Etiology unclear; imaging unremarkable, no chronic pancreatitis. Family history of pancreatic cancer noted, but current risk not elevated. - Continue Zenpep : 2-3 capsules with meals, 1 with snacks. - Educated on Zenpep  dosing based on meal fat content. - Advised to track and liberalize dietary fat intake as tolerated. - Ordered repeat fecal elastase, calprotectin, and H. pylori testing. - Recommended repeat abdominal MRI in August 2028 due to family history. - Continue vitamin D  2000 IU daily if not in multivitamin. - Reassured on prognosis and lack of increased cancer risk without chronic pancreatitis. - Advised against additional supplements at this time.  Family history of pancreas cancer in patient's brother Previously normal EUS and CT - MRI abdomen and pelvis with MRCP recommended August 2028 for screening  Irritable bowel syndrome with diarrhea (IBS-D) Chronic symptoms with  intermittent loose stools; anxiety exacerbates symptoms, partially managed with Buspirone. Dietary management and micronutrient intake adequate. - Continue low FODMAP diet, liberalize as tolerated. - Continue psyllium for stool consistency. - Continue Buspirone for anxiety, option to titrate dose. - Reassured on chronicity and variability of IBS-D symptoms. - Instructed to monitor and report if symptoms worsen.  Colon cancer screening Normal colonoscopy 2024 -Repeat screening colonoscopy recommended July 2034  45 minutes total spent today including patient facing time, coordination of care, reviewing medical history/procedures/pertinent radiology studies, and documentation of the encounter.  "

## 2024-11-02 NOTE — Patient Instructions (Signed)
 Continue Zenpep  1-2 capsules by mouth with meals and 1 capsule by mouth with snacks.   We will contact you to schedule your MRI of the abdomen in 05/2027.  _______________________________________________________  If your blood pressure at your visit was 140/90 or greater, please contact your primary care physician to follow up on this.  _______________________________________________________  If you are age 49 or older, your body mass index should be between 23-30. Your Body mass index is 25.06 kg/m. If this is out of the aforementioned range listed, please consider follow up with your Primary Care Provider.  If you are age 62 or younger, your body mass index should be between 19-25. Your Body mass index is 25.06 kg/m. If this is out of the aformentioned range listed, please consider follow up with your Primary Care Provider.   ________________________________________________________  The [facility] providers would like to encourage you to use MYCHART to communicate with providers for non-urgent requests or questions.  Due to long hold times on the telephone, sending your provider a message by [facility] may be a faster and more efficient way to get a response.  Please allow 48 business hours for a response.  Please remember that this is for non-urgent requests.  _______________________________________________________  [facility] is using a team-based approach to care.  Your team is made up of your doctor and two to three APPS. Our APPS (Nurse Practitioners and Physician Assistants) work with your physician to ensure care continuity for you. They are fully qualified to address your health concerns and develop a treatment plan. They communicate directly with your gastroenterologist to care for you. Seeing the Advanced Practice Practitioners on your physician's team can help you by facilitating care more promptly, often allowing for earlier appointments, access to diagnostic testing,  procedures, and other specialty referrals.

## 2024-11-03 ENCOUNTER — Ambulatory Visit: Payer: Self-pay | Admitting: Internal Medicine

## 2024-11-03 LAB — HELICOBACTER PYLORI  SPECIAL ANTIGEN
MICRO NUMBER:: 17502704
SPECIMEN QUALITY: ADEQUATE

## 2024-11-06 ENCOUNTER — Ambulatory Visit: Payer: Self-pay

## 2024-11-06 NOTE — Telephone Encounter (Addendum)
 Called patient as per Dr. Tina, no answer to call, called her husband to let him know the results of her xray. He voiced full understanding and had no further questions.   ----- Message from Pauletta Tina, MD sent at 11/06/2024  1:21 PM EST ----- Please let patient know chest X ray reported normal thanks

## 2024-11-08 ENCOUNTER — Other Ambulatory Visit: Payer: Self-pay | Admitting: Internal Medicine

## 2024-11-08 LAB — PANCREATIC ELASTASE, FECAL: Pancreatic Elastase-1, Stool: 199 ug/g — ABNORMAL LOW

## 2024-11-08 LAB — CALPROTECTIN: Calprotectin: 23 ug/g

## 2024-11-09 ENCOUNTER — Encounter: Payer: Self-pay | Admitting: Internal Medicine

## 2024-11-14 ENCOUNTER — Telehealth: Payer: Self-pay | Admitting: Internal Medicine

## 2024-11-14 NOTE — Telephone Encounter (Signed)
"   Disp Refills Start End   ZENPEP  40000-126000 units CPEP 300 capsule 2 11/09/2024 --   Sig: TAKE 2 CAPSULES BY MOUTH TWICE DAILY WITH A MEAL AND 1 CAPSULE WITH SNACKS. MAXIMUM 2 SNACKS DAILY; MAXIMUM DAILY DOSE IS 8 CAPSULES   Sent to pharmacy as: ZENPEP  40000-126000 units CPEP   E-Prescribing Status: Receipt confirmed by pharmacy (11/09/2024  8:37 AM EST)     Informed patient we approved a refill request from [facility] on 11/09/24 and got a confirmed receipt.  Patient states [facility] told her that they did not have pending refill from us . Patient states she is going to go up to [facility] and speak to someone in person and call me back and let me know what they say. "

## 2024-11-27 ENCOUNTER — Other Ambulatory Visit

## 2024-11-28 ENCOUNTER — Other Ambulatory Visit

## 2024-12-23 ENCOUNTER — Other Ambulatory Visit

## 2025-01-25 ENCOUNTER — Inpatient Hospital Stay

## 2025-02-01 ENCOUNTER — Inpatient Hospital Stay
